# Patient Record
Sex: Female | Born: 1975 | Race: White | Hispanic: No | Marital: Married | Smoking: Former smoker
Health system: Southern US, Community
[De-identification: ages and names within clinical notes are randomized; demographics above are authoritative.]

## PROBLEM LIST (undated history)

## (undated) DIAGNOSIS — G2581 Restless legs syndrome: Secondary | ICD-10-CM

## (undated) DIAGNOSIS — G629 Polyneuropathy, unspecified: Secondary | ICD-10-CM

## (undated) DIAGNOSIS — T7840XA Allergy, unspecified, initial encounter: Secondary | ICD-10-CM

## (undated) DIAGNOSIS — K589 Irritable bowel syndrome without diarrhea: Secondary | ICD-10-CM

## (undated) DIAGNOSIS — E7800 Pure hypercholesterolemia, unspecified: Secondary | ICD-10-CM

## (undated) DIAGNOSIS — F419 Anxiety disorder, unspecified: Secondary | ICD-10-CM

## (undated) DIAGNOSIS — R12 Heartburn: Secondary | ICD-10-CM

## (undated) DIAGNOSIS — D649 Anemia, unspecified: Secondary | ICD-10-CM

## (undated) DIAGNOSIS — J449 Chronic obstructive pulmonary disease, unspecified: Secondary | ICD-10-CM

## (undated) DIAGNOSIS — J45909 Unspecified asthma, uncomplicated: Secondary | ICD-10-CM

## (undated) DIAGNOSIS — J189 Pneumonia, unspecified organism: Secondary | ICD-10-CM

## (undated) DIAGNOSIS — K900 Celiac disease: Secondary | ICD-10-CM

## (undated) DIAGNOSIS — K219 Gastro-esophageal reflux disease without esophagitis: Secondary | ICD-10-CM

## (undated) DIAGNOSIS — F329 Major depressive disorder, single episode, unspecified: Secondary | ICD-10-CM

## (undated) DIAGNOSIS — F32A Depression, unspecified: Secondary | ICD-10-CM

## (undated) DIAGNOSIS — M1990 Unspecified osteoarthritis, unspecified site: Secondary | ICD-10-CM

## (undated) DIAGNOSIS — I10 Essential (primary) hypertension: Secondary | ICD-10-CM

## (undated) DIAGNOSIS — E669 Obesity, unspecified: Secondary | ICD-10-CM

## (undated) DIAGNOSIS — K2270 Barrett's esophagus without dysplasia: Secondary | ICD-10-CM

## (undated) DIAGNOSIS — M542 Cervicalgia: Secondary | ICD-10-CM

## (undated) HISTORY — DX: Polyneuropathy, unspecified: G62.9

## (undated) HISTORY — DX: Celiac disease: K90.0

## (undated) HISTORY — DX: Cervicalgia: M54.2

## (undated) HISTORY — DX: Depression, unspecified: F32.A

## (undated) HISTORY — DX: Unspecified osteoarthritis, unspecified site: M19.90

## (undated) HISTORY — DX: Barrett's esophagus without dysplasia: K22.70

## (undated) HISTORY — DX: Unspecified asthma, uncomplicated: J45.909

## (undated) HISTORY — PX: TUBAL LIGATION: SHX77

## (undated) HISTORY — DX: Heartburn: R12

## (undated) HISTORY — DX: Obesity, unspecified: E66.9

## (undated) HISTORY — DX: Pure hypercholesterolemia, unspecified: E78.00

## (undated) HISTORY — DX: Irritable bowel syndrome, unspecified: K58.9

## (undated) HISTORY — DX: Major depressive disorder, single episode, unspecified: F32.9

## (undated) HISTORY — DX: Chronic obstructive pulmonary disease, unspecified: J44.9

## (undated) HISTORY — DX: Anxiety disorder, unspecified: F41.9

## (undated) HISTORY — DX: Pneumonia, unspecified organism: J18.9

## (undated) HISTORY — DX: Anemia, unspecified: D64.9

## (undated) HISTORY — PX: COLONOSCOPY: SHX174

## (undated) HISTORY — DX: Gastro-esophageal reflux disease without esophagitis: K21.9

## (undated) HISTORY — DX: Allergy, unspecified, initial encounter: T78.40XA

## (undated) HISTORY — DX: Restless legs syndrome: G25.81

## (undated) HISTORY — DX: Essential (primary) hypertension: I10

---

## 2000-06-27 HISTORY — PX: TUBAL LIGATION: SHX77

## 2000-10-17 ENCOUNTER — Ambulatory Visit (HOSPITAL_COMMUNITY): Admission: RE | Admit: 2000-10-17 | Discharge: 2000-10-17 | Payer: Self-pay | Admitting: *Deleted

## 2000-10-17 ENCOUNTER — Encounter: Payer: Self-pay | Admitting: *Deleted

## 2015-02-12 DIAGNOSIS — F332 Major depressive disorder, recurrent severe without psychotic features: Secondary | ICD-10-CM | POA: Insufficient documentation

## 2015-02-12 DIAGNOSIS — F4310 Post-traumatic stress disorder, unspecified: Secondary | ICD-10-CM | POA: Insufficient documentation

## 2015-02-12 DIAGNOSIS — F411 Generalized anxiety disorder: Secondary | ICD-10-CM

## 2015-02-12 HISTORY — DX: Post-traumatic stress disorder, unspecified: F43.10

## 2015-02-12 HISTORY — DX: Generalized anxiety disorder: F41.1

## 2015-02-12 HISTORY — DX: Major depressive disorder, recurrent severe without psychotic features: F33.2

## 2015-11-27 DIAGNOSIS — I10 Essential (primary) hypertension: Secondary | ICD-10-CM | POA: Insufficient documentation

## 2015-11-27 DIAGNOSIS — K625 Hemorrhage of anus and rectum: Secondary | ICD-10-CM

## 2015-11-27 HISTORY — DX: Hemorrhage of anus and rectum: K62.5

## 2015-11-27 HISTORY — DX: Essential (primary) hypertension: I10

## 2016-06-06 DIAGNOSIS — M5030 Other cervical disc degeneration, unspecified cervical region: Secondary | ICD-10-CM

## 2016-06-06 DIAGNOSIS — M4306 Spondylolysis, lumbar region: Secondary | ICD-10-CM | POA: Insufficient documentation

## 2016-06-06 DIAGNOSIS — M5412 Radiculopathy, cervical region: Secondary | ICD-10-CM | POA: Insufficient documentation

## 2016-06-06 DIAGNOSIS — M51369 Other intervertebral disc degeneration, lumbar region without mention of lumbar back pain or lower extremity pain: Secondary | ICD-10-CM | POA: Insufficient documentation

## 2016-06-06 DIAGNOSIS — M5136 Other intervertebral disc degeneration, lumbar region: Secondary | ICD-10-CM | POA: Insufficient documentation

## 2016-06-06 DIAGNOSIS — M5416 Radiculopathy, lumbar region: Secondary | ICD-10-CM

## 2016-06-06 HISTORY — DX: Radiculopathy, lumbar region: M54.16

## 2016-06-06 HISTORY — DX: Spondylolysis, lumbar region: M43.06

## 2016-06-06 HISTORY — DX: Radiculopathy, cervical region: M54.12

## 2016-06-06 HISTORY — DX: Other cervical disc degeneration, unspecified cervical region: M50.30

## 2016-06-06 HISTORY — DX: Other intervertebral disc degeneration, lumbar region: M51.36

## 2016-07-05 DIAGNOSIS — M5124 Other intervertebral disc displacement, thoracic region: Secondary | ICD-10-CM | POA: Insufficient documentation

## 2016-07-05 HISTORY — DX: Other intervertebral disc displacement, thoracic region: M51.24

## 2016-07-06 HISTORY — PX: ESOPHAGOGASTRODUODENOSCOPY: SHX1529

## 2016-07-08 DIAGNOSIS — G8929 Other chronic pain: Secondary | ICD-10-CM | POA: Insufficient documentation

## 2016-07-08 DIAGNOSIS — M545 Low back pain, unspecified: Secondary | ICD-10-CM

## 2016-07-08 HISTORY — DX: Low back pain, unspecified: M54.50

## 2016-07-08 HISTORY — DX: Other chronic pain: G89.29

## 2016-08-15 DIAGNOSIS — G5603 Carpal tunnel syndrome, bilateral upper limbs: Secondary | ICD-10-CM

## 2016-08-15 HISTORY — DX: Carpal tunnel syndrome, bilateral upper limbs: G56.03

## 2016-11-03 DIAGNOSIS — R87810 Cervical high risk human papillomavirus (HPV) DNA test positive: Secondary | ICD-10-CM

## 2016-11-03 HISTORY — DX: Cervical high risk human papillomavirus (HPV) DNA test positive: R87.810

## 2016-11-28 DIAGNOSIS — M19079 Primary osteoarthritis, unspecified ankle and foot: Secondary | ICD-10-CM

## 2016-11-28 HISTORY — DX: Primary osteoarthritis, unspecified ankle and foot: M19.079

## 2016-12-26 DIAGNOSIS — M21611 Bunion of right foot: Secondary | ICD-10-CM | POA: Insufficient documentation

## 2016-12-26 HISTORY — DX: Bunion of right foot: M21.611

## 2017-06-27 HISTORY — PX: CARPAL TUNNEL RELEASE: SHX101

## 2017-10-30 DIAGNOSIS — S96911A Strain of unspecified muscle and tendon at ankle and foot level, right foot, initial encounter: Secondary | ICD-10-CM

## 2017-10-30 HISTORY — DX: Strain of unspecified muscle and tendon at ankle and foot level, right foot, initial encounter: S96.911A

## 2018-02-23 DIAGNOSIS — M7551 Bursitis of right shoulder: Secondary | ICD-10-CM

## 2018-02-23 HISTORY — DX: Morbid (severe) obesity due to excess calories: E66.01

## 2018-02-23 HISTORY — DX: Bursitis of right shoulder: M75.51

## 2018-03-20 ENCOUNTER — Encounter: Payer: Self-pay | Admitting: Gastroenterology

## 2018-03-29 ENCOUNTER — Encounter: Payer: Self-pay | Admitting: Gastroenterology

## 2018-03-30 ENCOUNTER — Encounter (INDEPENDENT_AMBULATORY_CARE_PROVIDER_SITE_OTHER): Payer: Self-pay

## 2018-03-30 ENCOUNTER — Other Ambulatory Visit (INDEPENDENT_AMBULATORY_CARE_PROVIDER_SITE_OTHER): Payer: Medicare Other

## 2018-03-30 ENCOUNTER — Encounter: Payer: Self-pay | Admitting: Gastroenterology

## 2018-03-30 ENCOUNTER — Ambulatory Visit (INDEPENDENT_AMBULATORY_CARE_PROVIDER_SITE_OTHER): Payer: Medicare Other | Admitting: Gastroenterology

## 2018-03-30 ENCOUNTER — Ambulatory Visit: Payer: Self-pay | Admitting: Gastroenterology

## 2018-03-30 VITALS — BP 132/84 | HR 94 | Ht 66.0 in | Wt 302.4 lb

## 2018-03-30 DIAGNOSIS — R1013 Epigastric pain: Secondary | ICD-10-CM

## 2018-03-30 DIAGNOSIS — K900 Celiac disease: Secondary | ICD-10-CM | POA: Diagnosis not present

## 2018-03-30 LAB — COMPREHENSIVE METABOLIC PANEL
ALT: 18 U/L (ref 0–35)
AST: 20 U/L (ref 0–37)
Albumin: 4 g/dL (ref 3.5–5.2)
Alkaline Phosphatase: 81 U/L (ref 39–117)
BILIRUBIN TOTAL: 0.6 mg/dL (ref 0.2–1.2)
BUN: 11 mg/dL (ref 6–23)
CALCIUM: 9.4 mg/dL (ref 8.4–10.5)
CO2: 27 meq/L (ref 19–32)
CREATININE: 0.85 mg/dL (ref 0.40–1.20)
Chloride: 102 mEq/L (ref 96–112)
GFR: 77.79 mL/min (ref >60.00)
GLUCOSE: 102 mg/dL — AB (ref 70–99)
Potassium: 4.2 mEq/L (ref 3.5–5.1)
SODIUM: 136 meq/L (ref 135–145)
Total Protein: 6.7 g/dL (ref 6.0–8.3)

## 2018-03-30 LAB — CBC WITH DIFFERENTIAL/PLATELET
BASOS ABS: 0 10*3/uL (ref 0.0–0.1)
BASOS PCT: 1 % (ref 0.0–3.0)
EOS ABS: 0.2 10*3/uL (ref 0.0–0.7)
Eosinophils Relative: 4.3 % (ref 0.0–5.0)
HCT: 32.5 % — ABNORMAL LOW (ref 36.0–46.0)
Hemoglobin: 10.3 g/dL — ABNORMAL LOW (ref 12.0–15.0)
LYMPHS ABS: 1.8 10*3/uL (ref 0.7–4.0)
LYMPHS PCT: 40 % (ref 12.0–46.0)
MCHC: 31.8 g/dL (ref 30.0–36.0)
MCV: 72.9 fl — ABNORMAL LOW (ref 78.0–100.0)
MONO ABS: 0.3 10*3/uL (ref 0.1–1.0)
Monocytes Relative: 7.3 % (ref 3.0–12.0)
NEUTROS ABS: 2.2 10*3/uL (ref 1.4–7.7)
Neutrophils Relative %: 47.4 % (ref 43.0–77.0)
Platelets: 295 10*3/uL (ref 150.0–400.0)
RBC: 4.45 Mil/uL (ref 3.87–5.11)
RDW: 17.3 % — ABNORMAL HIGH (ref 11.5–15.5)
WBC: 4.6 10*3/uL (ref 4.0–10.5)

## 2018-03-30 LAB — LIPASE: Lipase: 29 U/L (ref 11.0–59.0)

## 2018-03-30 MED ORDER — DEXLANSOPRAZOLE 60 MG PO CPDR: 60.0000 mg | DELAYED_RELEASE_CAPSULE | Freq: Every day | ORAL | 0 refills | Status: DC

## 2018-03-30 NOTE — Progress Notes (Signed)
Chief Complaint: Follow-up  Referring Provider: Mauricio Po FNP      ASSESSMENT AND PLAN;   #1.  RUQ pain/epigastric pain: ? CT neg 10/2017, strong FH of GB problems #2.  Celiac disease (last EGD 07/06/2016-Barrett's esophagus without dysplasia, small hiatal hernia, celiac wall biopsies intraepithelial lymphocytosis with villous blunting). Not compliant with gluten-free diet.  Had dietary consultation in [place] 2018. #3.  Gastroesophageal reflux with small hiatal hernia with Barrett's. Next EGD due 06/2019. #4.  Chronic constipation, neg colon 2018 at [facility] per pt (?[facility])  Plan: - Strict gluten free diet.  - Please obtain previous records -CT scan report from [facility]. - US abdomen complete, if neg HIDA with EF. If still with problems, proceed with solid-phase GES. - Check CBC, CMP, lipase and celiac antibodies (to determine compliance) today. - Change protonix to dexilant 60mg  po qd (samples given). - Miralax 17g po qd. - Follow-up in 12 weeks, earlier in case of any problems.  Patient and patient's husband will also let us know the name of the gastroenterologist/gastroenterology practice in [place] to obtain last colonoscopy report.   HPI:    Stacey French is a 42 y.o. female  Epigastric pain and RUQ pain-especially after eating, at times before eating.  Could not identify any definite foods which will trigger abdominal pain, associated with nausea with occasional vomiting.  Has been getting worse over the last 2 to 3 months. Has associated constipation, but has been using MiraLAX on as-needed basis. Last colon 2018 at ? [place] (No reports in care everwhere) per patient and patient's husband. Seen in urgent care center, sent to [facility] emergency room where she underwent CT scan of the abdomen pelvis in May 2019.  We do not have any records.  She was told that "everything was fine". No melena or hematochezia No significant weight loss -in fact  she has gained 2 pounds since the last admission. Has strong family history of gallbladder problems. Has been trying to be compliant with gluten-free diet    Past Medical History:  Diagnosis Date  . Anxiety   . Arthritis   . Asthma   . Barrett's esophagus without dysplasia   . Celiac disease   . Cervicalgia   . COPD (chronic obstructive pulmonary disease) (HCC)   . Depression   . Elevated cholesterol   . GERD (gastroesophageal reflux disease)   . Heartburn   . Hypertension   . IBS (irritable bowel syndrome)   . Pneumonia   . Polyneuropathy   . Restless leg syndrome     Past Surgical History:  Procedure Laterality Date  . COLONOSCOPY     2018  . ESOPHAGOGASTRODUODENOSCOPY  07/06/2016   Endoscopic finding suggestive of Barretts esophagus. Hiatal hernia, Mild gastritis.   . TUBAL LIGATION     at least 2002-17 years ago    Family History  Problem Relation Age of Onset  . Breast cancer Mother   . Thyroid cancer Daughter   . Colon cancer Neg Hx     Social History   Tobacco Use  . Smoking status: Former Games developer  . Smokeless tobacco: Never Used  . Tobacco comment: quit in 2002 started back and quit 2015  Substance Use Topics  . Alcohol use: Not Currently  . Drug use: Never    Current Outpatient Medications  Medication Sig Dispense Refill  . budesonide-formoterol (SYMBICORT) 80-4.5 MCG/ACT inhaler Inhale 2 puffs into the lungs daily.    Marland Kitchen buPROPion (WELLBUTRIN XL) 150 MG  24 hr tablet Take 150 mg by mouth daily.    . cetirizine (ZYRTEC) 10 MG tablet Take 10 mg by mouth daily.    . diclofenac (VOLTAREN) 75 MG EC tablet Take 75 mg by mouth 2 (two) times daily.    Marland Kitchen escitalopram (LEXAPRO) 20 MG tablet Take 20 mg by mouth daily.    Marland Kitchen HYDROcodone-acetaminophen (NORCO/VICODIN) 5-325 MG tablet Take 1-2 tablets by mouth every 6 (six) hours.    Marland Kitchen lisinopril-hydrochlorothiazide (PRINZIDE,ZESTORETIC) 20-12.5 MG tablet Take 1 tablet by mouth daily.    . pantoprazole (PROTONIX)  40 MG tablet Take 40 mg by mouth 2 (two) times daily.    . pravastatin (PRAVACHOL) 40 MG tablet Take 40 mg by mouth daily.    . pregabalin (LYRICA) 100 MG capsule Take 100 mg by mouth 3 (three) times daily.    . QUEtiapine (SEROQUEL) 200 MG tablet Take 200 mg by mouth at bedtime.    Marland Kitchen rOPINIRole (REQUIP) 0.5 MG tablet Take 0.5 mg by mouth at bedtime.    Marland Kitchen tiotropium (SPIRIVA) 18 MCG inhalation capsule Place 18 mcg into inhaler and inhale daily.    Marland Kitchen zolpidem (AMBIEN) 10 MG tablet Take 10 mg by mouth at bedtime.    Marland Kitchen albuterol (PROVENTIL HFA;VENTOLIN HFA) 108 (90 Base) MCG/ACT inhaler USE 2 PUFFS EVERY 4 TO 6 HOURS AS NEEDED  3   No current facility-administered medications for this visit.     Allergies  Allergen Reactions  . Latex   . Other     Dust and mold     Review of Systems:  Constitutional: Denies fever, chills, diaphoresis, appetite change and has fatigue.  HEENT: Denies photophobia, eye pain, redness, hearing loss, ear pain, congestion, sore throat, rhinorrhea, sneezing, mouth sores, neck pain, neck stiffness and tinnitus.   Respiratory: Denies SOB, DOE, chest tightness,  and wheezing.  Has cough. Cardiovascular: Denies chest pain, palpitations and leg swelling.  Genitourinary: Denies dysuria, urgency, frequency, hematuria, flank pain and difficulty urinating.  Musculoskeletal: Has myalgias, back pain, joint swelling, arthralgias and gait problem.  Skin: No rash.  Neurological: Denies dizziness, seizures, syncope, weakness, light-headedness, numbness and headaches.  Hematological: Denies adenopathy. Easy bruising, personal or family bleeding history  Psychiatric/Behavioral: Has anxiety or depression     Physical Exam:    BP 132/84   Pulse 94   Ht 5\' 6"  (1.676 m)   Wt (!) 302 lb 6 oz (137.2 kg)   BMI 48.80 kg/m  Filed Weights   03/30/18 0926  Weight: (!) 302 lb 6 oz (137.2 kg)   Constitutional:  Well-developed, in no acute distress.  Psychiatric: Normal mood and  affect. Behavior is normal. HEENT: Pupils normal.  Conjunctivae are normal. No scleral icterus. Neck supple.  Cardiovascular: Normal rate, regular rhythm. No edema Pulmonary/chest: Effort normal and breath sounds decreased bilaterally. No wheezing, rales or rhonchi. Abdominal: Soft, nondistended.  Epigastric tenderness.  Bowel sounds active throughout. There are no masses palpable. No hepatomegaly. Rectal:  defered Neurological: Alert and oriented to person place and time. Skin: Skin is warm and dry. No rashes noted. Examined in presence of patient's husband. Discussed with patient's husband in detail. Declines re-dietary consultation.   Edman Circle, MD 03/30/2018, 9:51 AM  Cc: Mauricio Po FNP

## 2018-03-30 NOTE — Patient Instructions (Addendum)
If you are age 42 or older, your body mass index should be between 23-30. Your Body mass index is 48.8 kg/m. If this is out of the aforementioned range listed, please consider follow up with your Primary Care Provider.  If you are age 16 or younger, your body mass index should be between 19-25. Your Body mass index is 48.8 kg/m. If this is out of the aformentioned range listed, please consider follow up with your Primary Care Provider.   You have been scheduled for an abdominal ultrasound at [facility] (1st floor of hospital) on 04/04/18 at 9am. Please arrive 15 minutes prior to your appointment for registration. Make certain not to have anything to eat or drink 6 hours prior to your appointment. Should you need to reschedule your appointment, please contact radiology at [phone]. This test typically takes about 30 minutes to perform.  Please go to the lab on the 2nd floor [address] before you leave the office today.   Please purchase the following medications over the counter and take as directed: Miralax 17 grams once daily.    Thank you,  Dr. Lynann Bologna

## 2018-04-03 ENCOUNTER — Telehealth: Payer: Self-pay | Admitting: Gastroenterology

## 2018-04-03 LAB — CELIAC PANEL 10
ANTIGLIADIN ABS, IGA: 43 U — AB (ref 0–19)
Endomysial IgA: NEGATIVE
GLIADIN IGG: 51 U — AB (ref 0–19)
IgA/Immunoglobulin A, Serum: 110 mg/dL (ref 87–352)
Tissue Transglut Ab: 4 U/mL (ref 0–5)
Transglutaminase IgA: 5 U/mL — ABNORMAL HIGH (ref 0–3)

## 2018-04-03 MED ORDER — DEXLANSOPRAZOLE 60 MG PO CPDR: 60.0000 mg | DELAYED_RELEASE_CAPSULE | Freq: Every day | ORAL | 3 refills | Status: DC

## 2018-04-03 NOTE — Telephone Encounter (Signed)
Sent prescription to patients pharmacy, patient is aware.  

## 2018-04-03 NOTE — Telephone Encounter (Signed)
Pt called to inform that dexilant works well and she wants a prescription sent to [facility] in [place].

## 2018-04-04 ENCOUNTER — Ambulatory Visit (HOSPITAL_BASED_OUTPATIENT_CLINIC_OR_DEPARTMENT_OTHER)
Admission: RE | Admit: 2018-04-04 | Discharge: 2018-04-04 | Disposition: A | Discharge status: Home / Self Care | Payer: Medicare Other | Source: Ambulatory Visit | Attending: Gastroenterology | Admitting: Gastroenterology

## 2018-04-04 DIAGNOSIS — R1013 Epigastric pain: Secondary | ICD-10-CM | POA: Diagnosis present

## 2018-04-04 DIAGNOSIS — K900 Celiac disease: Secondary | ICD-10-CM | POA: Insufficient documentation

## 2018-04-05 ENCOUNTER — Other Ambulatory Visit: Payer: Self-pay

## 2018-04-05 DIAGNOSIS — K900 Celiac disease: Secondary | ICD-10-CM

## 2018-04-26 ENCOUNTER — Ambulatory Visit: Payer: Medicare Other | Admitting: Dietician

## 2018-06-04 ENCOUNTER — Telehealth: Payer: Self-pay | Admitting: Gastroenterology

## 2018-06-04 NOTE — Telephone Encounter (Signed)
Pt stated that 2days before thanksgiving she was having some major problems ex like "peeing out blood". And want to speak with nurses for recommendation

## 2018-06-04 NOTE — Telephone Encounter (Signed)
PT call back to leave msg for nurse

## 2018-06-05 NOTE — Telephone Encounter (Signed)
Left message for patient to call back  

## 2018-06-06 NOTE — Telephone Encounter (Signed)
Called and spoke with patient-patient reports having bowel movements "like I am peeing myself"-reports she has lost 10 lbs in the last week from the bowel movements; patient reports she is following the gluten free diet as prescribed, however, if she does slip up and eat something with gluten she has horrible abdominal pain and can tell she has eaten eat by accident; the symptoms she is having now are not like the ones if she eats gluten; patient has been dealing with this consistently for the past two weeks-no matter what she eats she is affected with the extremely watery diarrhea-not episodes where she cannot hold her bowel movements in time for the toilet, patient denies rectal pain or rectal bleeding;  Please advise-as patient was informed to maintain an increase in oral fluids as to prevent dehydration;

## 2018-06-06 NOTE — Telephone Encounter (Signed)
Pt is returning your call #[phone]

## 2018-06-07 NOTE — Telephone Encounter (Signed)
Reviewed previous notes Used to be more constipated  Plan: -Must be on gluten-free diet -Stop MiraLAX or any other over-the-counter herbs/medications. -Avoid nonsteroidals  -Can we obtain last colonoscopy report (see last clinic note) and last CT report -Can use Imodium on as-needed basis. -Please make her a follow-up visit -Monitor weight and record

## 2018-06-07 NOTE — Telephone Encounter (Signed)
Left message for patient to call back  

## 2018-06-08 NOTE — Telephone Encounter (Signed)
Patient returned call to office-patient reports she has been seen by her PCP and was told she has E.Coli; patient stated "the office does not need to call me back concerning this issue";  Was unable to inform the patient of MD recommendations as of yet-does the office still need to notify the patient of your plan of care? Please advise on next step

## 2018-06-08 NOTE — Telephone Encounter (Signed)
Left message for patient to call back  

## 2018-06-08 NOTE — Telephone Encounter (Signed)
Usually E. coli infections are self-limiting. I cannot find the report in labs section. Did she get antibiotics? Should run its course.

## 2018-06-11 NOTE — Telephone Encounter (Signed)
Left message for patient to call back  

## 2018-06-12 NOTE — Telephone Encounter (Signed)
Left message for patient to call back  

## 2018-06-13 NOTE — Telephone Encounter (Signed)
Left message for patient to call back-message was left that patient could call back if desired however message would be completed/encounter signed

## 2018-07-03 ENCOUNTER — Other Ambulatory Visit (INDEPENDENT_AMBULATORY_CARE_PROVIDER_SITE_OTHER): Payer: Medicare Other

## 2018-07-03 ENCOUNTER — Ambulatory Visit (INDEPENDENT_AMBULATORY_CARE_PROVIDER_SITE_OTHER): Payer: Medicare Other | Admitting: Gastroenterology

## 2018-07-03 ENCOUNTER — Encounter: Payer: Self-pay | Admitting: Gastroenterology

## 2018-07-03 VITALS — BP 130/74 | HR 68 | Ht 66.0 in | Wt 272.0 lb

## 2018-07-03 DIAGNOSIS — K900 Celiac disease: Secondary | ICD-10-CM

## 2018-07-03 DIAGNOSIS — R197 Diarrhea, unspecified: Secondary | ICD-10-CM | POA: Diagnosis not present

## 2018-07-03 NOTE — Patient Instructions (Signed)
If you are age 43 or older, your body mass index should be between 23-30. Your Body mass index is 43.9 kg/m. If this is out of the aforementioned range listed, please consider follow up with your Primary Care Provider.  If you are age 19 or younger, your body mass index should be between 19-25. Your Body mass index is 43.9 kg/m. If this is out of the aformentioned range listed, please consider follow up with your Primary Care Provider.   You have been scheduled for a colonoscopy. Please follow written instructions given to you at your visit today.  Please pick up your prep supplies at the pharmacy within the next 1-3 days. If you use inhalers (even only as needed), please bring them with you on the day of your procedure. Your physician has requested that you go to www.startemmi.com and enter the access code given to you at your visit today. This web site gives a general overview about your procedure. However, you should still follow specific instructions given to you by our office regarding your preparation for the procedure.  Please purchase the following medications over the counter and take as directed: Immodium three times a day  Please go to the lab on the 2nd floor [address] before you leave the office today.   Thank you,  Dr. Lynann Bologna

## 2018-07-03 NOTE — Progress Notes (Signed)
Chief Complaint: Follow-up  Referring Provider: Mauricio Po FNP      ASSESSMENT AND PLAN;   #1. Diarrhea. H/O constipation. Neg CT 07/01/2018 at [facility] (report in care-everywhere), neg colon 2017 at [facility] per pt. Nl CMP, lipase, TSH 06/2018. Pt with mild anemia Hb 11.8, MCV 78 06/2018. #2. Celiac disease (last EGD 07/06/2016-Barrett's esophagus without dysplasia, small hiatal hernia, celiac wall biopsies intraepithelial lymphocytosis with villous blunting). Not compliant with gluten-free diet.  Had dietary consultation in [place] 2018. #3.  Gastroesophageal reflux with small hiatal hernia with Barrett's. Next EGD due 06/2019. #4.  RUQ pain/epigastric pain: ? CT neg 10/2017, strong FH of GB problems  Plan: - Strict gluten free diet.  - Stool for fat, WBC, GI pathogens and fecal elastase. - Proceed with colonoscopy with Bx to r/o microscopic colitis associated with celiac disease.  I have discussed the risks and benefits.  - Check celiac antibodies (to determine compliance), CRP, sed rate today. - Continue Protonix 40mg  po bid. - Follow-up in 12 weeks, earlier in case of any problems.  - Dietary consultation if celiac antibodies are still positive.   HPI:    Stacey French is a 43 y.o. female  Diarrhea 10-15/day, nocturnal, no blood, since Nov 2019 History of constipation Also had lower abdominal pain  Seen in the emergency room at [facility]-had normal CMP, TSH, CBC revealed mild anemia which is getting better.  CT scan of the abdomen and pelvis was unremarkable.  She has not been taking stool softeners or MiraLAX.  There is no history of recent travel.  Although she does assure me that she is on gluten-free diet, her antibodies were +3 months ago.  No weight loss.  She has been on antibiotics previously as an empiric treatment with Cipro without any significant relief.     Past Medical History:  Diagnosis Date  . Anxiety   . Arthritis   . Asthma   .  Barrett's esophagus without dysplasia   . Celiac disease   . Cervicalgia   . COPD (chronic obstructive pulmonary disease) (HCC)   . Depression   . Elevated cholesterol   . GERD (gastroesophageal reflux disease)   . Heartburn   . Hypertension   . IBS (irritable bowel syndrome)   . Pneumonia   . Polyneuropathy   . Restless leg syndrome     Past Surgical History:  Procedure Laterality Date  . COLONOSCOPY     2018  . ESOPHAGOGASTRODUODENOSCOPY  07/06/2016   Endoscopic finding suggestive of Barretts esophagus. Hiatal hernia, Mild gastritis.   . TUBAL LIGATION     at least 2002-17 years ago    Family History  Problem Relation Age of Onset  . Breast cancer Mother   . Thyroid cancer Daughter   . Colon cancer Neg Hx     Social History   Tobacco Use  . Smoking status: Former Games developer  . Smokeless tobacco: Never Used  . Tobacco comment: quit in 2002 started back and quit 2015  Substance Use Topics  . Alcohol use: Not Currently  . Drug use: Never    Current Outpatient Medications  Medication Sig Dispense Refill  . albuterol (PROVENTIL HFA;VENTOLIN HFA) 108 (90 Base) MCG/ACT inhaler USE 2 PUFFS EVERY 4 TO 6 HOURS AS NEEDED  3  . budesonide-formoterol (SYMBICORT) 80-4.5 MCG/ACT inhaler Inhale 2 puffs into the lungs daily.    Marland Kitchen buPROPion (WELLBUTRIN XL) 150 MG 24 hr tablet Take 150 mg by mouth daily.    Marland Kitchen  cetirizine (ZYRTEC) 10 MG tablet Take 10 mg by mouth daily.    Marland Kitchen dexlansoprazole (DEXILANT) 60 MG capsule Take 1 capsule (60 mg total) by mouth daily. 30 capsule 3  . diclofenac (VOLTAREN) 75 MG EC tablet Take 75 mg by mouth 2 (two) times daily.    Marland Kitchen escitalopram (LEXAPRO) 20 MG tablet Take 20 mg by mouth daily.    Marland Kitchen HYDROcodone-acetaminophen (NORCO/VICODIN) 5-325 MG tablet Take 1-2 tablets by mouth every 6 (six) hours.    Marland Kitchen lisinopril-hydrochlorothiazide (PRINZIDE,ZESTORETIC) 20-12.5 MG tablet Take 1 tablet by mouth daily.    . pantoprazole (PROTONIX) 40 MG tablet Take 40 mg by  mouth 2 (two) times daily.    . pravastatin (PRAVACHOL) 40 MG tablet Take 40 mg by mouth daily.    . pregabalin (LYRICA) 100 MG capsule Take 100 mg by mouth 3 (three) times daily.    . QUEtiapine (SEROQUEL) 200 MG tablet Take 200 mg by mouth at bedtime.    Marland Kitchen rOPINIRole (REQUIP) 0.5 MG tablet Take 0.5 mg by mouth at bedtime.    Marland Kitchen tiotropium (SPIRIVA) 18 MCG inhalation capsule Place 18 mcg into inhaler and inhale daily.    Marland Kitchen zolpidem (AMBIEN) 10 MG tablet Take 10 mg by mouth at bedtime.     No current facility-administered medications for this visit.     Allergies  Allergen Reactions  . Latex   . Other     Dust and mold     Review of Systems:  neg     Physical Exam:    BP 130/74   Pulse 68   Ht 5\' 6"  (1.676 m)   Wt 272 lb (123.4 kg)   BMI 43.90 kg/m  Filed Weights   07/03/18 1359  Weight: 272 lb (123.4 kg)   Constitutional:  Well-developed, in no acute distress.  Psychiatric: Normal mood and affect. Behavior is normal. HEENT: Pupils normal.  Conjunctivae are normal. No scleral icterus. Neck supple.  Cardiovascular: Normal rate, regular rhythm. No edema Pulmonary/chest: Effort normal and breath sounds decreased bilaterally. No wheezing, rales or rhonchi. Abdominal: Soft, nondistended.  Epigastric tenderness.  Bowel sounds active throughout. There are no masses palpable. No hepatomegaly. Rectal:  defered Neurological: Alert and oriented to person place and time. Skin: Skin is warm and dry. No rashes noted. Examined in presence of patient's husband. Discussed with patient's husband in detail. Declines re-dietary consultation. Extensive notes were reviewed.  Edman Circle, MD 07/03/2018, 2:16 PM  Cc: Mauricio Po FNP

## 2018-07-04 ENCOUNTER — Other Ambulatory Visit: Payer: Medicare Other

## 2018-07-04 DIAGNOSIS — R197 Diarrhea, unspecified: Secondary | ICD-10-CM

## 2018-07-04 LAB — C-REACTIVE PROTEIN: CRP: 8.4 mg/dL (ref 0.5–20.0)

## 2018-07-04 LAB — SEDIMENTATION RATE: Sed Rate: 111 mm/hr — ABNORMAL HIGH (ref 0–20)

## 2018-07-05 ENCOUNTER — Telehealth: Payer: Self-pay | Admitting: Gastroenterology

## 2018-07-05 ENCOUNTER — Encounter: Payer: Self-pay | Admitting: Gastroenterology

## 2018-07-05 ENCOUNTER — Ambulatory Visit (AMBULATORY_SURGERY_CENTER): Payer: Medicare Other | Admitting: Gastroenterology

## 2018-07-05 VITALS — BP 101/71 | HR 85 | Temp 97.5°F | Resp 20 | Ht 66.0 in | Wt 272.0 lb

## 2018-07-05 DIAGNOSIS — K52832 Lymphocytic colitis: Secondary | ICD-10-CM | POA: Diagnosis not present

## 2018-07-05 DIAGNOSIS — R197 Diarrhea, unspecified: Secondary | ICD-10-CM

## 2018-07-05 DIAGNOSIS — K633 Ulcer of intestine: Secondary | ICD-10-CM | POA: Diagnosis not present

## 2018-07-05 LAB — CELIAC PANEL 10
Antigliadin Abs, IgA: 26 units — ABNORMAL HIGH (ref 0–19)
Endomysial IgA: NEGATIVE
Gliadin IgG: 52 units — ABNORMAL HIGH (ref 0–19)
IgA/Immunoglobulin A, Serum: 104 mg/dL (ref 87–352)
Tissue Transglut Ab: 4 U/mL (ref 0–5)
Transglutaminase IgA: 3 U/mL (ref 0–3)

## 2018-07-05 MED ORDER — SODIUM CHLORIDE 0.9 % IV SOLN: 500.0000 mL | Freq: Once | INTRAVENOUS | Status: AC

## 2018-07-05 NOTE — Progress Notes (Signed)
Report to PACU, RN, vss, BBS= Clear.  

## 2018-07-05 NOTE — Op Note (Signed)
[facility] Patient Name: Stacey French Procedure Date: 07/05/2018 10:57 AM MRN: [id number] Endoscopist: Lynann Bologna , MD Age: 43 Referring MD:  Date of Birth: [birth year] Gender: Female Account #: [id number] Procedure:                Colonoscopy Indications:              Chronic diarrhea in a patient with history of                            celiac disease. Medicines:                Monitored Anesthesia Care Procedure:                Pre-Anesthesia Assessment:                           - Prior to the procedure, a History and Physical                            was performed, and patient medications and                            allergies were reviewed. The patient's tolerance of                            previous anesthesia was also reviewed. The risks                            and benefits of the procedure and the sedation                            options and risks were discussed with the patient.                            All questions were answered, and informed consent                            was obtained. Prior Anticoagulants: The patient has                            taken no previous anticoagulant or antiplatelet                            agents. ASA Grade Assessment: II - A patient with                            mild systemic disease. After reviewing the risks                            and benefits, the patient was deemed in                            satisfactory condition to undergo the procedure.  After obtaining informed consent, the colonoscope                            was passed under direct vision. Throughout the                            procedure, the patient's blood pressure, pulse, and                            oxygen saturations were monitored continuously. The                            Colonoscope was introduced through the anus and                            advanced to the 2 cm into the ileum. The                    colonoscopy was performed without difficulty. The                            patient tolerated the procedure well. The quality                            of the bowel preparation was adequate to identify                            polyps 6 mm and larger in size. Some retained solid                            stool specially in the right side of the colon. Scope In: 11:09:19 AM Scope Out: 11:25:44 AM Scope Withdrawal Time: 0 hours 13 minutes 36 seconds  Total Procedure Duration: 0 hours 16 minutes 25 seconds  Findings:                 The terminal ileum appeared normal. Biopsies were                            taken with a cold forceps for histology.                           3 superficial ulcers measuring approximately 1 cm                            each were found at the ileocecal valve. No bleeding                            was present. Biopsies were taken with a cold                            forceps for histology. The colonic mucosa in the                            remaining colon was normal  with well preserved                            vascular pattern. Multiple random colonic biopsies                            were obtained from throughout the colon and sent                            for histology.                           Small internal hemorrhoids on retroflexed                            examination.                           The exam was otherwise without abnormality on                            direct and retroflexion views. Complications:            No immediate complications. Estimated Blood Loss:     Estimated blood loss: none. Impression:               - Few ulcers at the ileocecal valve. ? Importance,                            (biopsied to rule out early Crohn's disease)                           - Otherwise normal colonoscopy to TI (was somewhat                            limited due to quality of preparation) Recommendation:           -  Patient has a contact number available for                            emergencies. The signs and symptoms of potential                            delayed complications were discussed with the                            patient. Return to normal activities tomorrow.                            Written discharge instructions were provided to the                            patient.                           - Resume previous gluten-free diet.                           -  Continue present medications.                           - Await pathology results.                           - Return to GI clinic in 4 weeks. Lynann Bologna, MD 07/05/2018 11:36:15 AM This report has been signed electronically.

## 2018-07-05 NOTE — Progress Notes (Signed)
1200:  After using the bathroom and getting dressed, pt sat in wheelchair and complained of 7/10 abdominal cramping and pain.  Instructed that it was most likely trapped air and that it would pass when pt moved around.  Levsin .25 mg given to ease discomfort.  Pt instructed to call Dr. Urban Gibson office if pain persisted or got worse.  Pt. Agreed.

## 2018-07-05 NOTE — Telephone Encounter (Signed)
Called back and spoke with patients spouse. She is still chilling after her procedure. No fever. Encouraged her to drink warm fluids and to let us know if she starts to run a fever or have any other post op symptoms listed on the AVS.

## 2018-07-05 NOTE — Progress Notes (Signed)
Called to room to assist during endoscopic procedure.  Patient ID and intended procedure confirmed with present staff. Received instructions for my participation in the procedure from the performing physician.  

## 2018-07-05 NOTE — Patient Instructions (Signed)
Handouts given for hemorrhoids  YOU HAD AN ENDOSCOPIC PROCEDURE TODAY AT THE [facility]:   Refer to the procedure report that was given to you for any specific questions about what was found during the examination.  If the procedure report does not answer your questions, please call your gastroenterologist to clarify.  If you requested that your care partner not be given the details of your procedure findings, then the procedure report has been included in a sealed envelope for you to review at your convenience later.  YOU SHOULD EXPECT: Some feelings of bloating in the abdomen. Passage of more gas than usual.  Walking can help get rid of the air that was put into your GI tract during the procedure and reduce the bloating. If you had a lower endoscopy (such as a colonoscopy or flexible sigmoidoscopy) you may notice spotting of blood in your stool or on the toilet paper. If you underwent a bowel prep for your procedure, you may not have a normal bowel movement for a few days.  Please Note:  You might notice some irritation and congestion in your nose or some drainage.  This is from the oxygen used during your procedure.  There is no need for concern and it should clear up in a day or so.  SYMPTOMS TO REPORT IMMEDIATELY:   Following lower endoscopy (colonoscopy or flexible sigmoidoscopy):  Excessive amounts of blood in the stool  Significant tenderness or worsening of abdominal pains  Swelling of the abdomen that is new, acute  Fever of 100F or higher   For urgent or emergent issues, a gastroenterologist can be reached at any hour by calling [phone].   DIET:  We do recommend a small meal at first, but then you may proceed to your regular diet.  Drink plenty of fluids but you should avoid alcoholic beverages for 24 hours.  ACTIVITY:  You should plan to take it easy for the rest of today and you should NOT DRIVE or use heavy machinery until tomorrow (because of the sedation  medicines used during the test).    FOLLOW UP: Our staff will call the number listed on your records the next business day following your procedure to check on you and address any questions or concerns that you may have regarding the information given to you following your procedure. If we do not reach you, we will leave a message.  However, if you are feeling well and you are not experiencing any problems, there is no need to return our call.  We will assume that you have returned to your regular daily activities without incident.  If any biopsies were taken you will be contacted by phone or by letter within the next 1-3 weeks.  Please call us at [phone] if you have not heard about the biopsies in 3 weeks.    SIGNATURES/CONFIDENTIALITY: You and/or your care partner have signed paperwork which will be entered into your electronic medical record.  These signatures attest to the fact that that the information above on your After Visit Summary has been reviewed and is understood.  Full responsibility of the confidentiality of this discharge information lies with you and/or your care-partner.

## 2018-07-06 ENCOUNTER — Telehealth: Payer: Self-pay | Admitting: *Deleted

## 2018-07-06 NOTE — Telephone Encounter (Signed)
  Follow up Call-  Call back number 07/05/2018  Post procedure Call Back phone  # [phone]  Permission to leave phone message Yes  Some recent data might be hidden     Patient questions:  Do you have a fever, pain , or abdominal swelling? No. Pain Score  0 *  Have you tolerated food without any problems? Yes.    Have you been able to return to your normal activities? Yes.    Do you have any questions about your discharge instructions: Diet   No. Medications  No. Follow up visit  No.  Do you have questions or concerns about your Care? No.  Actions: * If pain score is 4 or above: No action needed, pain <4. Patient stating she did call in yesterday related to being very chilled. Once she drank warm fluids, improved. No fever, a little abdominal discomfort, like gas. Patient will call if symptoms change

## 2018-07-10 LAB — FECAL LACTOFERRIN, QUANT
Fecal Lactoferrin: POSITIVE — AB
MICRO NUMBER:: 27769
SPECIMEN QUALITY:: ADEQUATE

## 2018-07-10 LAB — GASTROINTESTINAL PATHOGEN PANEL PCR
C. difficile Tox A/B, PCR: NOT DETECTED
Campylobacter, PCR: NOT DETECTED
Cryptosporidium, PCR: NOT DETECTED
E coli (ETEC) LT/ST PCR: NOT DETECTED
E coli (STEC) stx1/stx2, PCR: NOT DETECTED
E coli 0157, PCR: NOT DETECTED
Giardia lamblia, PCR: NOT DETECTED
Norovirus, PCR: NOT DETECTED
Rotavirus A, PCR: NOT DETECTED
SHIGELLA, PCR: NOT DETECTED
Salmonella, PCR: NOT DETECTED

## 2018-07-10 LAB — FECAL FAT, QUALITATIVE: FECAL FAT, QUALITATIVE: NORMAL

## 2018-07-12 ENCOUNTER — Ambulatory Visit: Payer: Medicare Other | Admitting: Gastroenterology

## 2018-07-23 ENCOUNTER — Telehealth: Payer: Self-pay | Admitting: Gastroenterology

## 2018-07-23 NOTE — Telephone Encounter (Signed)
Called and spoke with patient-patient reports she has been constipated for days and has bleeding with bowel movements (hemorrhoids); patient reports she has used Dulcolax stool softener, Metamucil for several days, has increase oral fluids; patient reports she has not tried Miralax because she cannot find a brand that is gluten free; patient reports she did not want to go against MD orders of refraining from using gluten products;   Please advise;

## 2018-07-23 NOTE — Telephone Encounter (Signed)
PT would like to know if there is anything she soul take because she is very constipated.Stacey French

## 2018-07-24 NOTE — Telephone Encounter (Signed)
She was having diarrhea before. Just increase water intake.

## 2018-07-25 NOTE — Telephone Encounter (Signed)
Left message for patient to call back  

## 2018-07-27 ENCOUNTER — Encounter: Payer: Self-pay | Admitting: Gastroenterology

## 2018-07-27 NOTE — Telephone Encounter (Signed)
Patient returned call to office- patient advised of MD recommendations; patient verbalized understanding of information/instructions; patient advised to call back to office if questions/concerns arise;

## 2018-08-16 ENCOUNTER — Ambulatory Visit: Payer: Medicare Other | Admitting: Gastroenterology

## 2019-02-06 ENCOUNTER — Telehealth: Payer: Self-pay

## 2019-02-06 NOTE — Telephone Encounter (Signed)
Called patient but mailbox was full.

## 2019-02-08 ENCOUNTER — Ambulatory Visit: Payer: Medicare Other | Admitting: Gastroenterology

## 2019-07-02 DIAGNOSIS — M21621 Bunionette of right foot: Secondary | ICD-10-CM

## 2019-07-02 DIAGNOSIS — M9271 Juvenile osteochondrosis of metatarsus, right foot: Secondary | ICD-10-CM

## 2019-07-02 HISTORY — DX: Juvenile osteochondrosis of metatarsus, right foot: M92.71

## 2019-07-02 HISTORY — DX: Bunionette of right foot: M21.621

## 2019-07-08 ENCOUNTER — Encounter: Payer: Self-pay | Admitting: Gastroenterology

## 2019-08-12 DIAGNOSIS — M654 Radial styloid tenosynovitis [de Quervain]: Secondary | ICD-10-CM

## 2019-08-12 HISTORY — DX: Radial styloid tenosynovitis (de quervain): M65.4

## 2019-09-12 ENCOUNTER — Ambulatory Visit: Payer: Medicare Other | Admitting: Gastroenterology

## 2019-10-07 ENCOUNTER — Other Ambulatory Visit: Payer: Self-pay

## 2019-10-07 ENCOUNTER — Ambulatory Visit: Payer: Medicare Other | Admitting: Gastroenterology

## 2019-10-07 ENCOUNTER — Encounter: Payer: Self-pay | Admitting: Gastroenterology

## 2019-10-07 VITALS — BP 114/72 | HR 88 | Temp 97.8°F | Ht 67.0 in | Wt 289.1 lb

## 2019-10-07 DIAGNOSIS — Z01818 Encounter for other preprocedural examination: Secondary | ICD-10-CM

## 2019-10-07 DIAGNOSIS — R1011 Right upper quadrant pain: Secondary | ICD-10-CM

## 2019-10-07 DIAGNOSIS — K22719 Barrett's esophagus with dysplasia, unspecified: Secondary | ICD-10-CM

## 2019-10-07 NOTE — Progress Notes (Signed)
Chief Complaint: Follow-up  Referring Provider: Heide Scales FNP      ASSESSMENT AND PLAN;    #1.  Celiac disease (last EGD 07/06/2016-Barrett's eso without dysplasia, small HH, SB Bx - IE lymphocytosis with villous blunting).  Celiac screen- positive. Had dietary consultation in [place] 2018. #2.  GERD with small HH with Barrett's. Next EGD due 06/2019. #3.  RUQ pain/epigastric pain: ? CT neg 10/2017, strong FH of GB problems.  May have musculoskeletal component as well. Neg US/CTA 01/03/2019. #5.  Abn LFTs likely d/t fatty liver. R/O other causes. Neg US/CTA 01/03/2019. #5.  Intermittent diarrhea. Pev H/O constipation. Neg US/CTA 01/03/2019. Neg colon 06/2018 except for few ulcers at IC valve (Bx- neg for Crohn's), neg random colon Bx for microscopic colitis.  Plan: - Continue gluten free diet.  - EGD for further evaluation.  She is due for EGD for FU Barrett's - HIDA with EF ro r/o biliary dyskinesia. - Wt loss (aim is to reduce 6lbs over the next 12 weeks) - Continue Protonix 75m po bid. - FU 12 weeks, earlier in case of any problems.  - Labs (she has a physical coming up with RHeide Scalesnext week and would like to get labs done from her office): CBC, CMP, acute viral hepatitis, autoimmune hepatitis panel (AMA, ASMA), iron studies, fasting lipid profile and GGT. -Check anti-HAV total Ab and HBsAb.  If negative, would recommend vaccination for hepatitis A and B.  Given written instructions.    HPI:    Stacey RUSSAWis a 44y.o. female  [facility] Abnormal LFTs with AST ALT 128, AST 71, alk phos 192 with Nl TB and alb. Neg RUQ [place] except for fatty liver. Neg CTA (chest/A/P).  Comes for follow-up visit. She does feel better. Continues to have right upper quadrant abdominal pain which at times is sharp, stabbing, gets worse with fatty foods.  Denies having any nausea or vomiting.  No heartburn (on Protonix twice daily).  Has  been on gluten-free diet but not very strictly.  Now only had diarrhea when she eats vegetables.  No melena or hematochezia.  Denies having any significant abdominal bloating.  No jaundice dark urine or pale stools.  Has gained weight as detailed below.  Wt Readings from Last 3 Encounters:  10/07/19 289 lb 2 oz (131.1 kg)  07/05/18 272 lb (123.4 kg)  07/03/18 272 lb (123.4 kg)      Past Medical History:  Diagnosis Date  . Anxiety   . Arthritis   . Asthma   . Barrett's esophagus without dysplasia   . Celiac disease   . Cervicalgia   . [place]   . Depression   . Elevated cholesterol   . GERD (gastroesophageal reflux disease)   . Heartburn   . Hypertension   . IBS (irritable bowel syndrome)   . Pneumonia   . Polyneuropathy   . Restless leg syndrome     Past Surgical History:  Procedure Laterality Date  . COLONOSCOPY     2018  . ESOPHAGOGASTRODUODENOSCOPY  07/06/2016   Endoscopic finding suggestive of Barretts esophagus. Hiatal hernia, Mild gastritis.   . TUBAL LIGATION     at least 2002-17 years ago    Family History  Problem Relation Age of Onset  . Breast cancer Mother   . Thyroid cancer Daughter   . Colon cancer Neg Hx   . Rectal cancer  Neg Hx     Social History   Tobacco Use  . Smoking status: Former Research scientist (life sciences)  . Smokeless tobacco: Never Used  . Tobacco comment: quit in 2002 started back and quit 2015  Substance Use Topics  . Alcohol use: Not Currently  . Drug use: Never    Current Outpatient Medications  Medication Sig Dispense Refill  . albuterol (PROVENTIL HFA;VENTOLIN HFA) 108 (90 Base) MCG/ACT inhaler USE 2 PUFFS EVERY 4 TO 6 HOURS AS NEEDED  3  . cetirizine (ZYRTEC) 10 MG tablet Take 10 mg by mouth daily.    . cloNIDine (CATAPRES) 0.2 MG tablet 1 tablet at bedtime.    Marland Kitchen escitalopram (LEXAPRO) 20 MG tablet Take 20 mg by mouth daily.    Marland Kitchen LAMICTAL XR 200 MG TB24 24 hour tablet Take 1 tablet by mouth daily.     Marland Kitchen lisinopril-hydrochlorothiazide (PRINZIDE,ZESTORETIC) 20-12.5 MG tablet Take 1 tablet by mouth daily.    . Multiple Vitamin (MULTIVITAMIN) capsule Take 1 capsule by mouth.    . nabumetone (RELAFEN) 500 MG tablet 1 tablet 2 (two) times daily.    . pantoprazole (PROTONIX) 40 MG tablet Take 40 mg by mouth 2 (two) times daily.    . pravastatin (PRAVACHOL) 40 MG tablet Take 40 mg by mouth daily.    . QUEtiapine (SEROQUEL) 300 MG tablet Take 300 mg by mouth at bedtime.    Marland Kitchen rOPINIRole (REQUIP) 0.5 MG tablet Take 0.5 mg by mouth at bedtime.    Marland Kitchen tiotropium (SPIRIVA) 18 MCG inhalation capsule Place 18 mcg into inhaler and inhale daily.    . traMADol (ULTRAM) 50 MG tablet 1 mg.    . zolpidem (AMBIEN) 10 MG tablet Take 10 mg by mouth at bedtime.     Current Facility-Administered Medications  Medication Dose Route Frequency Provider Last Rate Last Admin  . 0.9 %  sodium chloride infusion  500 mL Intravenous Once Jackquline Denmark, MD        Allergies  Allergen Reactions  . Molds & Smuts Other (See Comments)    PER ALLERGY TEST  . Other     Dust and mold   . Latex Rash    Review of Systems:  neg     Physical Exam:    BP 114/72   Pulse 88   Temp 97.8 F (36.6 C)   Ht 5' 7" (1.702 m)   Wt 289 lb 2 oz (131.1 kg)   BMI 45.28 kg/m  Filed Weights   10/07/19 1315  Weight: 289 lb 2 oz (131.1 kg)   Constitutional:  Well-developed, in no acute distress.  Psychiatric: Normal mood and affect. Behavior is normal. HEENT: Pupils normal.  Conjunctivae are normal. No scleral icterus. Neck supple.  Cardiovascular: Normal rate, regular rhythm. No edema Pulmonary/chest: Effort normal and breath sounds decreased bilaterally. No wheezing, rales or rhonchi. Abdominal: Soft, nondistended.  Epigastric tenderness.  Bowel sounds active throughout. There are no masses palpable. No hepatomegaly. Rectal:  defered Neurological: Alert and oriented to person place and time. Skin: Skin is warm and dry. No  rashes noted. Examined in presence of patient's daughter  Carmell Austria, MD 10/07/2019, 1:50 PM  Cc: Heide Scales FNP

## 2019-10-07 NOTE — Patient Instructions (Signed)
If you are age 44 or older, your body mass index should be between 23-30. Your Body mass index is 45.28 kg/m. If this is out of the aforementioned range listed, please consider follow up with your Primary Care Provider.  If you are age 34 or younger, your body mass index should be between 19-25. Your Body mass index is 45.28 kg/m. If this is out of the aformentioned range listed, please consider follow up with your Primary Care Provider.   You have been scheduled for a HIDA scan at [facility] (1st floor) on 10/16/19. Please arrive 15 minutes prior to your scheduled appointment at  8am. Make certain not to have anything to eat or drink at least 6 hours prior to your test. Should this appointment date or time not work well for you, please call radiology scheduling at [phone].  _____________________________________________________________________ hepatobiliary (HIDA) scan is an imaging procedure used to diagnose problems in the liver, gallbladder and bile ducts. In the HIDA scan, a radioactive chemical or tracer is injected into a vein in your arm. The tracer is handled by the liver like bile. Bile is a fluid produced and excreted by your liver that helps your digestive system break down fats in the foods you eat. Bile is stored in your gallbladder and the gallbladder releases the bile when you eat a meal. A special nuclear medicine scanner (gamma camera) tracks the flow of the tracer from your liver into your gallbladder and small intestine.  During your HIDA scan  You'll be asked to change into a hospital gown before your HIDA scan begins. Your health care team will position you on a table, usually on your back. The radioactive tracer is then injected into a vein in your arm.The tracer travels through your bloodstream to your liver, where it's taken up by the bile-producing cells. The radioactive tracer travels with the bile from your liver into your gallbladder and through your bile ducts to  your small intestine.You may feel some pressure while the radioactive tracer is injected into your vein. As you lie on the table, a special gamma camera is positioned over your abdomen taking pictures of the tracer as it moves through your body. The gamma camera takes pictures continually for about an hour. You'll need to keep still during the HIDA scan. This can become uncomfortable, but you may find that you can lessen the discomfort by taking deep breaths and thinking about other things. Tell your health care team if you're uncomfortable. The radiologist will watch on a computer the progress of the radioactive tracer through your body. The HIDA scan may be stopped when the radioactive tracer is seen in the gallbladder and enters your small intestine. This typically takes about an hour. In some cases extra imaging will be performed if original images aren't satisfactory, if morphine is given to help visualize the gallbladder or if the medication CCK is given to look at the contraction of the gallbladder. This test typically takes 2 hours to complete. ________________________________________________________________________  Stacey French have been scheduled for an endoscopy. Please follow written instructions given to you at your visit today. If you use inhalers (even only as needed), please bring them with you on the day of your procedure. Your physician has requested that you go to www.startemmi.com and enter the access code given to you at your visit today. This web site gives a general overview about your procedure. However, you should still follow specific instructions given to you by our office regarding your  preparation for the procedure.  Please have the following labs drawn at your primary care doctors office:  CBC,CMP, Acute Viral Hepatitis, AMA, ASMA, Iron Studies, Fasting Lipid profile. GGT, Anti-HAV total Ab, HBAb.   Thank you,  Dr. Lynann Bologna

## 2019-10-14 ENCOUNTER — Encounter: Payer: Self-pay | Admitting: Gastroenterology

## 2019-10-14 DIAGNOSIS — M461 Sacroiliitis, not elsewhere classified: Secondary | ICD-10-CM | POA: Insufficient documentation

## 2019-10-14 HISTORY — DX: Sacroiliitis, not elsewhere classified: M46.1

## 2019-10-16 ENCOUNTER — Other Ambulatory Visit: Payer: Self-pay

## 2019-10-16 ENCOUNTER — Encounter (HOSPITAL_COMMUNITY)
Admission: RE | Admit: 2019-10-16 | Discharge: 2019-10-16 | Disposition: A | Discharge status: Home / Self Care | Payer: Medicare HMO | Source: Ambulatory Visit | Attending: Gastroenterology | Admitting: Gastroenterology

## 2019-10-16 DIAGNOSIS — R1011 Right upper quadrant pain: Secondary | ICD-10-CM | POA: Diagnosis present

## 2019-10-24 ENCOUNTER — Other Ambulatory Visit: Payer: Self-pay | Admitting: Gastroenterology

## 2019-10-24 ENCOUNTER — Ambulatory Visit (INDEPENDENT_AMBULATORY_CARE_PROVIDER_SITE_OTHER): Payer: Medicare HMO

## 2019-10-24 DIAGNOSIS — Z1159 Encounter for screening for other viral diseases: Secondary | ICD-10-CM

## 2019-10-25 LAB — SARS CORONAVIRUS 2 (TAT 6-24 HRS): SARS Coronavirus 2: NEGATIVE

## 2019-10-28 ENCOUNTER — Encounter: Payer: Self-pay | Admitting: Gastroenterology

## 2019-10-28 ENCOUNTER — Other Ambulatory Visit: Payer: Self-pay

## 2019-10-28 ENCOUNTER — Ambulatory Visit (AMBULATORY_SURGERY_CENTER): Payer: Medicare HMO | Admitting: Gastroenterology

## 2019-10-28 VITALS — BP 121/58 | HR 75 | Temp 97.5°F | Resp 18 | Ht 67.0 in | Wt 289.0 lb

## 2019-10-28 DIAGNOSIS — K2270 Barrett's esophagus without dysplasia: Secondary | ICD-10-CM

## 2019-10-28 DIAGNOSIS — K449 Diaphragmatic hernia without obstruction or gangrene: Secondary | ICD-10-CM | POA: Diagnosis not present

## 2019-10-28 DIAGNOSIS — Z8719 Personal history of other diseases of the digestive system: Secondary | ICD-10-CM

## 2019-10-28 DIAGNOSIS — R1011 Right upper quadrant pain: Secondary | ICD-10-CM

## 2019-10-28 MED ORDER — SODIUM CHLORIDE 0.9 % IV SOLN: 500.0000 mL | Freq: Once | INTRAVENOUS | Status: DC

## 2019-10-28 NOTE — Progress Notes (Signed)
Called to room to assist during endoscopic procedure.  Patient ID and intended procedure confirmed with present staff. Received instructions for my participation in the procedure from the performing physician.  

## 2019-10-28 NOTE — Op Note (Signed)
[facility] Patient Name: Stacey French Procedure Date: 10/28/2019 9:58 AM MRN: [id number] Endoscopist: Lynann Bologna , MD Age: 44 Referring MD:  Date of Birth: Jan 22, 1976 Gender: Female Account #: [id number] Procedure:                Upper GI endoscopy Indications:              1. Previously diagnosed Barrett's esophagus. 2.                            History of celiac on gluten-free diet. Medicines:                Monitored Anesthesia Care Procedure:                Pre-Anesthesia Assessment:                           - Prior to the procedure, a History and Physical                            was performed, and patient medications and                            allergies were reviewed. The patient's tolerance of                            previous anesthesia was also reviewed. The risks                            and benefits of the procedure and the sedation                            options and risks were discussed with the patient.                            All questions were answered, and informed consent                            was obtained. Prior Anticoagulants: The patient has                            taken no previous anticoagulant or antiplatelet                            agents. ASA Grade Assessment: III - A patient with                            severe systemic disease. After reviewing the risks                            and benefits, the patient was deemed in                            satisfactory condition to undergo the procedure.  After obtaining informed consent, the endoscope was                            passed under direct vision. Throughout the                            procedure, the patient's blood pressure, pulse, and                            oxygen saturations were monitored continuously. The                            Endoscope was introduced through the mouth, and                            advanced to the  second part of duodenum. The upper                            GI endoscopy was accomplished without difficulty.                            The patient tolerated the procedure well. Scope In: Scope Out: Findings:                 The esophagus and gastroesophageal junction were                            examined with white light and narrow band imaging                            (NBI). There were esophageal mucosal changes                            secondary to established long-segment Barrett's                            disease. These changes involved the mucosa at the                            upper extent of the gastric folds (34 cm from the                            incisors) extending to the Z-line (30 cm from the                            incisors). Salmon-colored mucosa was present.                            Mucosa was biopsied with a cold forceps for                            histology in 4 quadrants at intervals of 2 cm. A  total of 3 specimen bottles were sent to pathology.                           A 2 cm hiatal hernia was present extending from 34                            up to 36 cm. The retroflexed examination of the                            cardia was difficult d/t J-shaped stomach and                            inability of patient to retain air. It did reveal                            Hill's Gd III GE junction flap.                           Gastric mucosa was normal. Biopsies were taken with                            a cold forceps for histology from the body of the                            stomach and antrum.                           The examined duodenum was normal. Biopsies for                            histology were taken with a cold forceps for                            evaluation of celiac disease. Complications:            No immediate complications. Estimated Blood Loss:     Estimated blood loss: none. Impression:                -Long segment Barrett's esophagus. (Biopsied).                           -Small hiatal hernia.                           -Duodenal mucosa. Normal (biopsied) Recommendation:           - Patient has a contact number available for                            emergencies. The signs and symptoms of potential                            delayed complications were discussed with the  patient. Return to normal activities tomorrow.                            Written discharge instructions were provided to the                            patient.                           - Resume previous gluten-free diet.                           - Continue present medications including Protonix.                           - Await pathology results.                           - Return to GI clinic in 6 weeks.                           - The findings and recommendations were discussed                            with the patient's family. Jackquline Denmark, MD 10/28/2019 10:25:33 AM This report has been signed electronically.

## 2019-10-28 NOTE — Progress Notes (Signed)
Report to PACU, RN, vss, BBS= Clear.  

## 2019-10-28 NOTE — Patient Instructions (Signed)
Information on hiatal hernias, gastritis and Barrett's given to you today.  Await pathology results.  Continue present medications, including Protonix.  Return to GI clinic in 6 weeks.  YOU HAD AN ENDOSCOPIC PROCEDURE TODAY AT THE [facility]:   Refer to the procedure report that was given to you for any specific questions about what was found during the examination.  If the procedure report does not answer your questions, please call your gastroenterologist to clarify.  If you requested that your care partner not be given the details of your procedure findings, then the procedure report has been included in a sealed envelope for you to review at your convenience later.  YOU SHOULD EXPECT: Some feelings of bloating in the abdomen. Passage of more gas than usual.  Walking can help get rid of the air that was put into your GI tract during the procedure and reduce the bloating. If you had a lower endoscopy (such as a colonoscopy or flexible sigmoidoscopy) you may notice spotting of blood in your stool or on the toilet paper. If you underwent a bowel prep for your procedure, you may not have a normal bowel movement for a few days.  Please Note:  You might notice some irritation and congestion in your nose or some drainage.  This is from the oxygen used during your procedure.  There is no need for concern and it should clear up in a day or so.  SYMPTOMS TO REPORT IMMEDIATELY:    Following upper endoscopy (EGD)  Vomiting of blood or coffee ground material  New chest pain or pain under the shoulder blades  Painful or persistently difficult swallowing  New shortness of breath  Fever of 100F or higher  Black, tarry-looking stools  For urgent or emergent issues, a gastroenterologist can be reached at any hour by calling [phone]. Do not use MyChart messaging for urgent concerns.    DIET:  We do recommend a small meal at first, but then you may proceed to your regular diet.   Drink plenty of fluids but you should avoid alcoholic beverages for 24 hours.  ACTIVITY:  You should plan to take it easy for the rest of today and you should NOT DRIVE or use heavy machinery until tomorrow (because of the sedation medicines used during the test).    FOLLOW UP: Our staff will call the number listed on your records 48-72 hours following your procedure to check on you and address any questions or concerns that you may have regarding the information given to you following your procedure. If we do not reach you, we will leave a message.  We will attempt to reach you two times.  During this call, we will ask if you have developed any symptoms of COVID 19. If you develop any symptoms (ie: fever, flu-like symptoms, shortness of breath, cough etc.) before then, please call [phone].  If you test positive for Covid 19 in the 2 weeks post procedure, please call and report this information to [place].    If any biopsies were taken you will be contacted by phone or by letter within the next 1-3 weeks.  Please call us at [phone] if you have not heard about the biopsies in 3 weeks.    SIGNATURES/CONFIDENTIALITY: You and/or your care partner have signed paperwork which will be entered into your electronic medical record.  These signatures attest to the fact that that the information above on your After Visit Summary has been reviewed and is  understood.  Full responsibility of the confidentiality of this discharge information lies with you and/or your care-partner. 

## 2019-10-30 ENCOUNTER — Encounter: Payer: Self-pay | Admitting: Gastroenterology

## 2019-10-30 ENCOUNTER — Telehealth: Payer: Self-pay

## 2019-10-30 NOTE — Telephone Encounter (Signed)
  Follow up Call-  Call back number 10/28/2019 07/05/2018  Post procedure Call Back phone  # [phone]  Permission to leave phone message Yes Yes  Some recent data might be hidden     Patient questions:  Do you have a fever, pain , or abdominal swelling? No. Pain Score  0 *  Have you tolerated food without any problems? Yes.    Have you been able to return to your normal activities? Yes.    Do you have any questions about your discharge instructions: Diet   No. Medications  No. Follow up visit  No.  Do you have questions or concerns about your Care? No.  Actions: * If pain score is 4 or above: No action needed, pain <4.  1. Have you developed a fever since your procedure? no  2.   Have you had an respiratory symptoms (SOB or cough) since your procedure? no  3.   Have you tested positive for COVID 19 since your procedure no  4.   Have you had any family members/close contacts diagnosed with the COVID 19 since your procedure?  no   If yes to any of these questions please route to Laverna Peace, RN and Charlett Lango, RN

## 2019-11-13 ENCOUNTER — Telehealth: Payer: Self-pay

## 2019-11-13 NOTE — Telephone Encounter (Signed)
Left voicemail for patient to schedule her 6 weeks f/u appointment from her procedure (10-28-2019)

## 2019-12-03 NOTE — Telephone Encounter (Signed)
Called patient and left voicemail to call pack to schedule a follow up appointment from procedure

## 2019-12-11 NOTE — Telephone Encounter (Signed)
Called and spoke with daughter Martie Lee and she said that she will have her mom call back regarding setting up an appointment for her follow up appointment.

## 2020-04-08 DIAGNOSIS — N1831 Chronic kidney disease, stage 3a: Secondary | ICD-10-CM

## 2020-04-08 DIAGNOSIS — M1712 Unilateral primary osteoarthritis, left knee: Secondary | ICD-10-CM

## 2020-04-08 HISTORY — DX: Unilateral primary osteoarthritis, left knee: M17.12

## 2020-04-08 HISTORY — DX: Chronic kidney disease, stage 3a: N18.31

## 2021-02-25 HISTORY — PX: FOOT FRACTURE SURGERY: SHX645

## 2021-04-27 ENCOUNTER — Other Ambulatory Visit: Payer: Medicare HMO

## 2021-04-27 ENCOUNTER — Other Ambulatory Visit (INDEPENDENT_AMBULATORY_CARE_PROVIDER_SITE_OTHER): Payer: Medicare HMO

## 2021-04-27 ENCOUNTER — Ambulatory Visit (INDEPENDENT_AMBULATORY_CARE_PROVIDER_SITE_OTHER): Payer: Medicare HMO | Admitting: Gastroenterology

## 2021-04-27 ENCOUNTER — Other Ambulatory Visit: Payer: Self-pay

## 2021-04-27 ENCOUNTER — Encounter: Payer: Self-pay | Admitting: Gastroenterology

## 2021-04-27 VITALS — BP 130/82 | HR 94

## 2021-04-27 DIAGNOSIS — K900 Celiac disease: Secondary | ICD-10-CM | POA: Diagnosis not present

## 2021-04-27 DIAGNOSIS — K449 Diaphragmatic hernia without obstruction or gangrene: Secondary | ICD-10-CM | POA: Diagnosis not present

## 2021-04-27 DIAGNOSIS — R7989 Other specified abnormal findings of blood chemistry: Secondary | ICD-10-CM

## 2021-04-27 DIAGNOSIS — K219 Gastro-esophageal reflux disease without esophagitis: Secondary | ICD-10-CM | POA: Diagnosis not present

## 2021-04-27 LAB — COMPREHENSIVE METABOLIC PANEL
ALT: 44 U/L — ABNORMAL HIGH (ref 0–35)
AST: 23 U/L (ref 0–37)
Albumin: 4.3 g/dL (ref 3.5–5.2)
Alkaline Phosphatase: 206 U/L — ABNORMAL HIGH (ref 39–117)
BUN: 15 mg/dL (ref 6–23)
CO2: 24 mEq/L (ref 19–32)
Calcium: 9.4 mg/dL (ref 8.4–10.5)
Chloride: 100 mEq/L (ref 96–112)
Creatinine, Ser: 0.96 mg/dL (ref 0.40–1.20)
GFR: 71.45 mL/min (ref >60.00)
Glucose, Bld: 117 mg/dL — ABNORMAL HIGH (ref 70–99)
Potassium: 3.8 mEq/L (ref 3.5–5.1)
Sodium: 134 mEq/L — ABNORMAL LOW (ref 135–145)
Total Bilirubin: 0.6 mg/dL (ref 0.2–1.2)
Total Protein: 7.1 g/dL (ref 6.0–8.3)

## 2021-04-27 LAB — CBC WITH DIFFERENTIAL/PLATELET
Basophils Absolute: 0.1 10*3/uL (ref 0.0–0.1)
Basophils Relative: 0.9 % (ref 0.0–3.0)
Eosinophils Absolute: 0.2 10*3/uL (ref 0.0–0.7)
Eosinophils Relative: 3.5 % (ref 0.0–5.0)
HCT: 36.9 % (ref 36.0–46.0)
Hemoglobin: 12 g/dL (ref 12.0–15.0)
Lymphocytes Relative: 32 % (ref 12.0–46.0)
Lymphs Abs: 2.2 10*3/uL (ref 0.7–4.0)
MCHC: 32.4 g/dL (ref 30.0–36.0)
MCV: 82 fl (ref 78.0–100.0)
Monocytes Absolute: 0.5 10*3/uL (ref 0.1–1.0)
Monocytes Relative: 6.9 % (ref 3.0–12.0)
Neutro Abs: 3.9 10*3/uL (ref 1.4–7.7)
Neutrophils Relative %: 56.7 % (ref 43.0–77.0)
Platelets: 312 10*3/uL (ref 150.0–400.0)
RBC: 4.5 Mil/uL (ref 3.87–5.11)
RDW: 17.4 % — ABNORMAL HIGH (ref 11.5–15.5)
WBC: 6.8 10*3/uL (ref 4.0–10.5)

## 2021-04-27 NOTE — Patient Instructions (Signed)
If you are age 45 or older, your body mass index should be between 23-30. Your There is no height or weight on file to calculate BMI. If this is out of the aforementioned range listed, please consider follow up with your Primary Care Provider.  If you are age 16 or younger, your body mass index should be between 19-25. Your There is no height or weight on file to calculate BMI. If this is out of the aformentioned range listed, please consider follow up with your Primary Care Provider.   ________________________________________________________  The [facility] providers would like to encourage you to use [facility] to communicate with providers for non-urgent requests or questions.  Due to long hold times on the telephone, sending your provider a message by [facility] may be a faster and more efficient way to get a response.  Please allow 48 business hours for a response.  Please remember that this is for non-urgent requests.  _______________________________________________________  Please go to the lab on the 2nd floor [address] before you leave the office today.   Continue gluten free diet  Continue protonix.  Try to lose 6 pounds over the next 3 months.  Please call with any questions or concerns.  Thank you,  Dr. Lynann Bologna

## 2021-04-27 NOTE — Progress Notes (Signed)
Chief Complaint: Follow-up  Referring Provider: Mauricio Po FNP      ASSESSMENT AND PLAN;    #1.  Celiac disease  #2.  GERD with small HH with Barrett's esophagus. #3.  H/O Abn LFTs (nl now) d/t fatty liver . R/O other causes. #4.  Mild IDA likely d/t GYN causes.  Plan: - Continue gluten free diet.  - CBC, CMP, iron studies, celiac panel (to check for compliance) - If anemic, CT AP to r/o crohns - Wt loss (aim is to reduce 6lbs over the next 12 weeks) - Rpt EGD for Barrett's surveillance 10/2021 - Continue Protonix 40mg  po bid    HPI:    Stacey French is a 45 y.o. female   For follow-up visit.  Doing very well from GI standpoint.  No nausea, vomiting, heartburn, regurgitation, odynophagia or dysphagia.  No significant diarrhea or constipation.  No melena or hematochezia. No unintentional weight loss. No abdominal pain.  Has been compliant with gluten-free diet.  Also does not want to reduce Protonix as she has breakthrough symptoms if she does not take it twice a day.  Was found to have mild anemia 01/2021 with Hb 11.7, MCV 80.  Her iron studies showed saturation 8%, TIBC 400, ferritin 10, iron 40.  She had normal CMP with normal LFTs.  Albumin 4.0.  Had heme-negative stools.  Had normal B12 and folate.  S/P R foot Sx 02/2021 and is currently recovering.  Used to have heavy menstrual cycles 1 yr ago. Not bad now.  Does not want any further GI work-up at the present time.  Past GI procedures:  EGD 07/06/2016-Barrett's eso without dysplasia, small HH, SB Bx - IE lymphocytosis with villous blunting).  Celiac screen- positive.  EGD 10/2019 -Long segment Barrett's esophagus. (Bx-Barrett's without dysplasia). -Small hiatal hernia. -Duodenal mucosa. Normal (bx-no villous atrophy)  Colonoscopy 06/2018 - Few ulcers at the ileocecal valve. ? Importance, (biopsied to rule out early Crohn's disease) - Otherwise normal colonoscopy to TI (was somewhat limited due to quality  of preparation -Bx: 1. Surgical [P], terminal ileum - BENIGN SMALL BOWEL MUCOSA. - NO VILLOUS ATROPHY, INFLAMMATION OR OTHER ABNORMALITIES PRESENT. 2. Surgical [P], ileocecal valve - SEVERELY ACTIVE COLITIS WITH ULCERATION. - FRAGMENTS OF ADIPOSE TISSUE. - SEE COMMENT. 3. Surgical [P], random sites - LYMPHOCYTIC COLITIS.  HIDA with EF 09/2019: Nl.  Borderline EF 32% with Ensure  Neg RUQ 10/2019 01/03/2019 except for fatty liver.  Neg CTA (chest/A/P) 12/2018: neg    Past Medical History:  Diagnosis Date   Allergy    SEASONAL   Anemia    Anxiety    Arthritis    Asthma    Barrett's esophagus without dysplasia    Celiac disease    Cervicalgia    COPD (chronic obstructive pulmonary disease) (HCC)    Depression    Elevated cholesterol    GERD (gastroesophageal reflux disease)    Heartburn    Hypertension    IBS (irritable bowel syndrome)    Pneumonia    Polyneuropathy    Restless leg syndrome     Past Surgical History:  Procedure Laterality Date   CARPAL TUNNEL RELEASE  2019   COLONOSCOPY     2018   ESOPHAGOGASTRODUODENOSCOPY  07/06/2016   Endoscopic finding suggestive of Barretts esophagus. Hiatal hernia, Mild gastritis.    TUBAL LIGATION     at least 2002-17 years ago    Family History  Problem Relation Age of Onset   Breast cancer  Mother    Thyroid cancer Daughter    Colon cancer Neg Hx    Rectal cancer Neg Hx    Colon polyps Neg Hx    Esophageal cancer Neg Hx    Stomach cancer Neg Hx     Social History   Tobacco Use   Smoking status: Former   Smokeless tobacco: Never   Tobacco comments:    quit in 2002 started back and quit 2015  Vaping Use   Vaping Use: Never used  Substance Use Topics   Alcohol use: Not Currently   Drug use: Never    Current Outpatient Medications  Medication Sig Dispense Refill   albuterol (PROVENTIL HFA;VENTOLIN HFA) 108 (90 Base) MCG/ACT inhaler USE 2 PUFFS EVERY 4 TO 6 HOURS AS NEEDED  3   cetirizine (ZYRTEC) 10 MG tablet  Take 10 mg by mouth daily.     cloNIDine (CATAPRES) 0.2 MG tablet 1 tablet at bedtime.     LAMICTAL 150 MG tablet Take 300 mg by mouth daily.     lisinopril-hydrochlorothiazide (PRINZIDE,ZESTORETIC) 20-12.5 MG tablet Take 1 tablet by mouth daily.     MELATONIN TR 10 MG TBCR Take 1 tablet by mouth at bedtime as needed.     Multiple Vitamin (MULTIVITAMIN) capsule Take 1 capsule by mouth.     pantoprazole (PROTONIX) 40 MG tablet Take 40 mg by mouth 2 (two) times daily.     pravastatin (PRAVACHOL) 40 MG tablet Take 40 mg by mouth daily.     QUEtiapine (SEROQUEL) 300 MG tablet Take 300 mg by mouth at bedtime.     rOPINIRole (REQUIP) 0.5 MG tablet Take 0.5 mg by mouth at bedtime.     tiotropium (SPIRIVA) 18 MCG inhalation capsule Place 18 mcg into inhaler and inhale daily.     zolpidem (AMBIEN) 10 MG tablet Take 10 mg by mouth at bedtime.     Current Facility-Administered Medications  Medication Dose Route Frequency Provider Last Rate Last Admin   0.9 %  sodium chloride infusion  500 mL Intravenous Once Jackquline Denmark, MD        Allergies  Allergen Reactions   Molds & Smuts Other (See Comments)    PER ALLERGY TEST   Other     Dust and mold    Latex Rash    Review of Systems:  neg     Physical Exam:    BP 130/82   Pulse 94   SpO2 97%  Filed Weights   Constitutional:  Well-developed, in no acute distress.  Psychiatric: Normal mood and affect. Behavior is normal. HEENT: Pupils normal.  Conjunctivae are normal. No scleral icterus. Abdominal: Soft, nondistended. Bowel sounds active throughout. There are no masses palpable. No hepatomegaly. Rectal:  defered Neurological: Alert and oriented to person place and time. Skin: Skin is warm and dry. No rashes noted.  Right foot bandaged.  Carmell Austria, MD 04/27/2021, 11:39 AM  Cc: Heide Scales FNP

## 2021-04-28 LAB — IRON,TIBC AND FERRITIN PANEL
%SAT: 14 % (calc) — ABNORMAL LOW (ref 16–45)
Ferritin: 17 ng/mL (ref 16–232)
Iron: 61 ug/dL (ref 40–190)
TIBC: 434 mcg/dL (calc) (ref 250–450)

## 2021-04-29 LAB — CELIAC PANEL 10
Antigliadin Abs, IgA: 8 units (ref 0–19)
Endomysial IgA: NEGATIVE
Gliadin IgG: 12 units (ref 0–19)
IgA/Immunoglobulin A, Serum: 126 mg/dL (ref 87–352)
Tissue Transglut Ab: <2 U/mL (ref 0–5)
Transglutaminase IgA: <2 U/mL (ref 0–3)

## 2021-05-11 NOTE — Progress Notes (Signed)
Please inform the patient. Celiac serology neg c/w compliance with gluten-free diet Alk phos has increased Hemoglobin is normal, iron saturation is 10% Plan: -Continue with gluten-free diet -[place] abdo complete  -Continue iron supplements -Need to reduce weight as per last note. Send report to family physician

## 2021-05-12 ENCOUNTER — Other Ambulatory Visit: Payer: Self-pay

## 2021-05-12 DIAGNOSIS — K449 Diaphragmatic hernia without obstruction or gangrene: Secondary | ICD-10-CM

## 2021-05-12 DIAGNOSIS — K219 Gastro-esophageal reflux disease without esophagitis: Secondary | ICD-10-CM

## 2021-05-12 DIAGNOSIS — R748 Abnormal levels of other serum enzymes: Secondary | ICD-10-CM

## 2021-05-12 DIAGNOSIS — K900 Celiac disease: Secondary | ICD-10-CM

## 2021-05-17 ENCOUNTER — Telehealth: Payer: Self-pay

## 2021-05-17 NOTE — Telephone Encounter (Signed)
[place] complete from [facility] on 11-17 shows fatty liver but otherwise normal. I sent it to PCP as well

## 2021-05-19 ENCOUNTER — Ambulatory Visit (HOSPITAL_COMMUNITY): Payer: Medicare HMO

## 2021-05-31 NOTE — Telephone Encounter (Signed)
Letter sent.

## 2021-08-25 ENCOUNTER — Encounter: Payer: Self-pay | Admitting: Gastroenterology

## 2021-09-14 ENCOUNTER — Encounter: Payer: Self-pay | Admitting: Cardiology

## 2021-09-14 ENCOUNTER — Encounter: Payer: Self-pay | Admitting: *Deleted

## 2021-10-14 ENCOUNTER — Ambulatory Visit: Payer: Medicare HMO | Admitting: Cardiology

## 2021-10-14 ENCOUNTER — Encounter: Payer: Self-pay | Admitting: Cardiology

## 2021-10-14 VITALS — BP 114/72 | HR 88 | Ht 66.0 in | Wt 303.6 lb

## 2021-10-14 DIAGNOSIS — I272 Pulmonary hypertension, unspecified: Secondary | ICD-10-CM | POA: Diagnosis not present

## 2021-10-14 DIAGNOSIS — I10 Essential (primary) hypertension: Secondary | ICD-10-CM | POA: Diagnosis not present

## 2021-10-14 DIAGNOSIS — E785 Hyperlipidemia, unspecified: Secondary | ICD-10-CM

## 2021-10-14 DIAGNOSIS — R0609 Other forms of dyspnea: Secondary | ICD-10-CM

## 2021-10-14 DIAGNOSIS — R072 Precordial pain: Secondary | ICD-10-CM

## 2021-10-14 NOTE — Patient Instructions (Signed)
Medication Instructions:  ?Your physician recommends that you continue on your current medications as directed. Please refer to the Current Medication list given to you today.  ?*If you need a refill on your cardiac medications before your next appointment, please call your pharmacy* ? ? ?Lab Work: ?None Ordered ?If you have labs (blood work) drawn today and your tests are completely normal, you will receive your results only by: ?MyChart Message (if you have MyChart) OR ?A paper copy in the mail ?If you have any lab test that is abnormal or we need to change your treatment, we will call you to review the results. ? ? ?Testing/Procedures: ?Your physician has requested that you have an echocardiogram. Echocardiography is a painless test that uses sound waves to create images of your heart. It provides your doctor with information about the size and shape of your heart and how well your heart?s chambers and valves are working. This procedure takes approximately one hour. There are no restrictions for this procedure.  ? ?Your physician has requested that you have a lexiscan myoview. For further information please visit [email or web address] Please follow instruction sheet, as given. ? ?The test will take approximately 3 to 4 hours to complete; you may bring reading material.  If someone comes with you to your appointment, they will need to remain in the main lobby due to limited space in the testing area. **If you are pregnant or breastfeeding, please notify the nuclear lab prior to your appointment** ? ?How to prepare for your Myocardial Perfusion Test: ?Do not eat or drink 3 hours prior to your test, except you may have water. ?Do not consume products containing caffeine (regular or decaffeinated) 12 hours prior to your test. (ex: coffee, chocolate, sodas, tea). ?Do bring a list of your current medications with you.  If not listed below, you may take your medications as normal. ?Do wear comfortable clothes (no dresses  or overalls) and walking shoes, tennis shoes preferred (No heels or open toe shoes are allowed). ?Do NOT wear cologne, perfume, aftershave, or lotions (deodorant is allowed). ?If these instructions are not followed, your test will have to be rescheduled.   ? ? ?Follow-Up: ?At [facility], you and your health needs are our priority.  As part of our continuing mission to provide you with exceptional heart care, we have created designated Provider Care Teams.  These Care Teams include your primary Cardiologist (physician) and Advanced Practice Providers (APPs -  Physician Assistants and Nurse Practitioners) who all work together to provide you with the care you need, when you need it. ? ?We recommend signing up for the patient portal called "MyChart".  Sign up information is provided on this After Visit Summary.  MyChart is used to connect with patients for Virtual Visits (Telemedicine).  Patients are able to view lab/test results, encounter notes, upcoming appointments, etc.  Non-urgent messages can be sent to your provider as well.   ?To learn more about what you can do with MyChart, go to [email or web address].   ? ?Your next appointment:   ?2 month(s) ? ?The format for your next appointment:   ?In Person ? ?Provider:   ?Gypsy Balsam, MD  ? ? ?Other Instructions ?None ? ?Important Information About Sugar ? ? ? ? ? ? ?

## 2021-10-14 NOTE — Addendum Note (Signed)
Addended by: Baldo Ash D on: 10/14/2021 10:16 AM ? ? Modules accepted: Orders ? ?

## 2021-10-14 NOTE — Progress Notes (Addendum)
? ?Cardiology Consultation:   ? ?Date:  10/14/2021  ? ?ID:  Stacey French, DOB 1975-11-22, MRN [id number] ? ?PCP:  Stacey Riches, Stacey French  ?Cardiologist:  Jenne Campus, MD  ? ?Referring MD: Stacey Riches, Stacey French  ? ?Chief Complaint  ?Patient presents with  ? Follow-up  ?  CT show enlarge pulmonary artery   ? ? ?History of Present Illness:   ? ?Stacey French is a 46 y.o. female who is being seen today for the evaluation of pulmonary hypertension at the request of Stacey French, Stacey Melena, Stacey French.  Past medical history significant for morbid obesity, essential hypertension, dyslipidemia.  Recently she ended up having CT of her neck done.  Description of the CT described enlargement of the pulmonary artery raising suspicion for pulmonary hypertension.  I did review record from the hospital she did have CT of her chest done in 2022 which showed normal size of the pulmonary artery that she had another CT done in 2022 which did not comment on the size of the pulmonary artery however there was a description of the 5 mg motion artifact and assessment was impossible to do.  Overall she says she is doing well but for last 6 months has been complaining of having more shortness of breath.  For example she said when she goes into [employer] from front to the back shortness of breath will happen.  She denies have any palpitation but she described to have some chest pains.  Chest pain happen in different situation.  Sometimes at rest sometimes when she does things last for few minutes with rather mild she is being multiple times because of this sensation to the emergency room and all this all the time work-up has been negative.  She does not smoke she does not drink she does not exercise on a regular basis.  She does have some anxiety.  He does not have family history of premature coronary artery disease she does not have family history of pulmonary hypertension however her brother apparently got some heart trouble and he she was told her he is  heart was twice the size of normal heart and he eventually end up passing. ? ?Past Medical History:  ?Diagnosis Date  ? Allergy   ? SEASONAL  ? Anemia   ? Anxiety   ? Arthritis   ? Arthritis of metatarsophalangeal joint 11/28/2016  ? Asthma   ? Barrett's esophagus without dysplasia   ? Bilateral carpal tunnel syndrome 08/15/2016  ? Bright red blood per rectum 11/27/2015  ? Bunion, right 12/26/2016  ? Celiac disease   ? Cervical high risk HPV (human papillomavirus) test positive 11/03/2016  ? Formatting of this note might be different from the original.  Pap 5/18- negative, high risk HPV positive (16&18 negative).  Plan to repeat pap in 12 months  ? Cervical radiculopathy 06/06/2016  ? Chronic midline low back pain without sciatica 07/08/2016  ? Chronic neck pain 07/08/2016  ? COPD (chronic obstructive pulmonary disease) ([place])   ? DDD (degenerative disc disease), cervical 06/06/2016  ? De Quervain's disease (tenosynovitis) 08/12/2019  ? Elevated cholesterol   ? Essential hypertension 11/27/2015  ? 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 Generalized anxiety disorder 02/12/2015  ? GERD (gastroesophageal reflux disease)   ? Heartburn   ? Hypertension   ? IBS (irritable bowel syndrome)   ? Lumbar degenerative disc disease 06/06/2016  ? Lumbar pars defect 06/06/2016  ? Lumbar radiculopathy 06/06/2016  ? Major depressive disorder, recurrent episode, severe (  [place]) 02/12/2015  ? Morbid obesity, unspecified obesity type ([place]) 02/23/2018  ? Obesity   ? Pneumonia   ? Polyneuropathy   ? Posttraumatic stress disorder 02/12/2015  ? Primary osteoarthritis of left knee 04/08/2020  ? Restless leg syndrome   ? Right foot strain, initial encounter 10/30/2017  ? Sacroiliitis ([place]) 10/14/2019  ? Stage 3a chronic kidney disease ([place]) 04/08/2020  ? Subacromial bursitis of right shoulder joint 02/23/2018  ? Tailor's bunionette, right 07/02/2019  ? Thoracic disc herniation 07/05/2016  ? ? ?Past Surgical History:  ?Procedure Laterality Date   ? CARPAL TUNNEL RELEASE  2019  ? COLONOSCOPY    ? 2018  ? ESOPHAGOGASTRODUODENOSCOPY  07/06/2016  ? Endoscopic finding suggestive of Barretts esophagus. Hiatal hernia, Mild gastritis.   ? FOOT FRACTURE SURGERY Right 02/2021  ? TUBAL LIGATION  2002  ? at least 2002-17 years ago  ? ? ?Current Medications: ?Current Meds  ?Medication Sig  ? albuterol (PROVENTIL HFA;VENTOLIN HFA) 108 (90 Base) MCG/ACT inhaler 1 puff every 6 (six) hours as needed for wheezing or shortness of breath.  ? aspirin EC 81 MG tablet Take 81 mg by mouth daily. Swallow whole.  ? cetirizine (ZYRTEC) 10 MG tablet Take 10 mg by mouth daily.  ? Cholecalciferol 1.25 MG (50000 UT) capsule Take 50,000 Units by mouth once a week.  ? cyclobenzaprine (FLEXERIL) 5 MG tablet Take 5 mg by mouth 3 (three) times daily.  ? lamoTRIgine (LAMICTAL) 150 MG tablet Take 150 mg by mouth in the morning and at bedtime.  ? lisinopril-hydrochlorothiazide (PRINZIDE,ZESTORETIC) 20-12.5 MG tablet Take 1 tablet by mouth daily.  ? Multiple Vitamin (MULTIVITAMIN) capsule Take 1 capsule by mouth daily.  ? pantoprazole (PROTONIX) 40 MG tablet Take 40 mg by mouth 2 (two) times daily.  ? pravastatin (PRAVACHOL) 80 MG tablet Take 80 mg by mouth daily.  ? PROZAC 40 MG capsule Take 40 mg by mouth every morning.  ? QUEtiapine (SEROQUEL) 300 MG tablet Take 300 mg by mouth at bedtime.  ? rOPINIRole (REQUIP) 0.5 MG tablet Take 0.5 mg by mouth at bedtime.  ? tiotropium (SPIRIVA) 18 MCG inhalation capsule Place 18 mcg into inhaler and inhale daily.  ? ?Current Facility-Administered Medications for the 10/14/21 encounter (Office Visit) with Park Liter, MD  ?Medication  ? 0.9 %  sodium chloride infusion  ?  ? ?Allergies:   Molds & smuts, Other, and Latex  ? ?Social History  ? ?Socioeconomic History  ? Marital status: Married  ?  Spouse name: Not on file  ? Number of children: 3  ? Years of education: Not on file  ? Highest education level: Not on file  ?Occupational History  ?  Occupation: disabled  ?Tobacco Use  ? Smoking status: Former  ?  Packs/day: 2.00  ?  Years: 20.00  ?  Pack years: 40.00  ?  Types: Cigarettes  ?  Quit date: 2015  ?  Years since quitting: 8.3  ? Smokeless tobacco: Never  ? Tobacco comments:  ?  quit in 2002 started back and quit 2015  ?Vaping Use  ? Vaping Use: Never used  ?Substance and Sexual Activity  ? Alcohol use: Not Currently  ? Drug use: Never  ? Sexual activity: Not on file  ?Other Topics Concern  ? Not on file  ?Social History Narrative  ? Not on file  ? ?Social Determinants of Health  ? ?Financial Resource Strain: Not on file  ?Food Insecurity: Not on file  ?Transportation Needs: Not  on file  ?Physical Activity: Not on file  ?Stress: Not on file  ?Social Connections: Not on file  ?  ? ?Family History: ?The patient's family history includes Breast cancer in her mother; Cancer in her father; Hypertension in her mother; Lymphoma in her mother; Thyroid cancer in her daughter. There is no history of Colon cancer, Rectal cancer, Colon polyps, Esophageal cancer, or Stomach cancer. ?ROS:   ?Please see the history of present illness.    ?All 14 point review of systems negative except as described per history of present illness. ? ?EKGs/Labs/Other Studies Reviewed:   ? ?The following studies were reviewed today: ?CT neck showed mild enlargement main pulmonary artery with can be seen pulmonary hypertension ? ?EKG:  EKG is  ordered today.  The ekg ordered today demonstrates normal sinus rhythm 93, right axis deviation, low voltage QRS. ? ?Recent Labs: ?04/27/2021: ALT 44; BUN 15; Creatinine, Ser 0.96; Hemoglobin 12.0; Platelets 312.0; Potassium 3.8; Sodium 134  ?Recent Lipid Panel ?No results found for: CHOL, TRIG, HDL, CHOLHDL, VLDL, LDLCALC, LDLDIRECT ? ?Physical Exam:   ? ?VS:  BP 114/72 (BP Location: Left Arm, Patient Position: Sitting)   Pulse 88   Ht 5\' 6"  (1.676 m)   Wt (!) 303 lb 9.6 oz (137.7 kg)   SpO2 96%   BMI 49.00 kg/m?    ? ?Wt Readings from Last 3  Encounters:  ?10/14/21 (!) 303 lb 9.6 oz (137.7 kg)  ?08/31/21 (!) 302 lb (137 kg)  ?10/28/19 289 lb (131.1 kg)  ?  ? ?GEN:  Well nourished, well developed in no acute distress ?HEENT: Normal ?NECK: No JVD; No ca

## 2021-10-15 ENCOUNTER — Telehealth: Payer: Self-pay

## 2021-10-15 DIAGNOSIS — R079 Chest pain, unspecified: Secondary | ICD-10-CM

## 2021-10-15 NOTE — Telephone Encounter (Signed)
d 

## 2021-10-19 ENCOUNTER — Telehealth: Payer: Self-pay | Admitting: *Deleted

## 2021-10-19 ENCOUNTER — Encounter: Payer: Self-pay | Admitting: *Deleted

## 2021-10-19 NOTE — Telephone Encounter (Signed)
Unable to leave a message on voicemail in reference to upcoming appointment scheduled for 10/26/21. No mychart available and the instruction letter sent by [employer] Freida Nebel, Adelene Idler ? ? ?

## 2021-10-26 ENCOUNTER — Ambulatory Visit (INDEPENDENT_AMBULATORY_CARE_PROVIDER_SITE_OTHER): Payer: Medicare HMO

## 2021-10-26 DIAGNOSIS — R0609 Other forms of dyspnea: Secondary | ICD-10-CM | POA: Diagnosis not present

## 2021-10-26 DIAGNOSIS — R072 Precordial pain: Secondary | ICD-10-CM

## 2021-10-26 LAB — ECHOCARDIOGRAM COMPLETE
Area-P 1/2: 3.46 cm2
Height: 66 in
S' Lateral: 2.9 cm
Weight: 4848 oz

## 2021-10-26 MED ORDER — REGADENOSON 0.4 MG/5ML IV SOLN
0.4000 mg | Freq: Once | INTRAVENOUS | Status: AC
  Administered 2021-10-26: 0.4 mg via INTRAVENOUS

## 2021-10-26 MED ORDER — TECHNETIUM TC 99M TETROFOSMIN IV KIT
29.3000 | PACK | Freq: Once | INTRAVENOUS | Status: AC | PRN
  Administered 2021-10-26: 29.3 via INTRAVENOUS

## 2021-10-27 ENCOUNTER — Ambulatory Visit: Payer: Medicare HMO

## 2021-10-27 LAB — MYOCARDIAL PERFUSION IMAGING
LV dias vol: 101 mL (ref 46–106)
LV sys vol: 31 mL
Nuc Stress EF: 69 %
Peak HR: 99 {beats}/min
Rest HR: 88 {beats}/min
Rest Nuclear Isotope Dose: 32.6 mCi
SDS: 5
SRS: 4
SSS: 9
ST Depression (mm): 0 mm
Stress Nuclear Isotope Dose: 29.3 mCi
TID: 0.87

## 2021-10-27 MED ORDER — TECHNETIUM TC 99M TETROFOSMIN IV KIT
32.6000 | PACK | Freq: Once | INTRAVENOUS | Status: AC | PRN
Start: 2021-10-27 — End: 2021-10-27
  Administered 2021-10-27: 32.6 via INTRAVENOUS

## 2021-10-29 ENCOUNTER — Telehealth: Payer: Self-pay

## 2021-10-29 NOTE — Telephone Encounter (Signed)
-----   Message from Georgeanna Lea, MD sent at 10/27/2021  9:41 PM EDT ----- ?Stress test normal ?

## 2021-10-29 NOTE — Telephone Encounter (Signed)
Patient notified of results.

## 2021-10-29 NOTE — Telephone Encounter (Signed)
-----   Message from Georgeanna Lea, MD sent at 10/27/2021  9:30 PM EDT ----- ?Echocardiogram showing normal left ventricle ejection fraction mild left ventricle hypertrophy overall looks good ?

## 2021-12-16 ENCOUNTER — Encounter: Payer: Self-pay | Admitting: Cardiology

## 2021-12-16 ENCOUNTER — Ambulatory Visit: Payer: Medicare HMO | Admitting: Cardiology

## 2021-12-16 VITALS — BP 122/80 | HR 93 | Ht 65.0 in | Wt 296.0 lb

## 2021-12-16 DIAGNOSIS — E785 Hyperlipidemia, unspecified: Secondary | ICD-10-CM

## 2021-12-16 DIAGNOSIS — I272 Pulmonary hypertension, unspecified: Secondary | ICD-10-CM | POA: Diagnosis not present

## 2021-12-16 DIAGNOSIS — I10 Essential (primary) hypertension: Secondary | ICD-10-CM

## 2021-12-16 DIAGNOSIS — R0609 Other forms of dyspnea: Secondary | ICD-10-CM

## 2021-12-16 DIAGNOSIS — N1831 Chronic kidney disease, stage 3a: Secondary | ICD-10-CM

## 2021-12-16 NOTE — Progress Notes (Signed)
Cardiology Office Note:    Date:  12/16/2021   ID:  Stacey French, DOB May 25, 1976, MRN [id number]  PCP:  Dema Severin, NP  Cardiologist:  Gypsy Balsam, MD    Referring MD: Dema Severin, NP   Chief Complaint  Patient presents with   Follow-up  Doing fine  History of Present Illness:    Stacey French is a 46 y.o. female she was referred to [place] for potential evaluation of pulmonary hypertension.  She did have CT of her chest done which showed enlargement of the pulmonary artery after that echocardiogram has been performed which showed normal right ventricle size normal pulm artery pressure.  She is coming today to my office to follow-up on that.  She is doing well denies have any chest pain tightness squeezing pressure burning chest, she lost some weight after she changed her diet and feeling much better.  Denies have any chest pain tightness squeezing pressure burning chest.  Past Medical History:  Diagnosis Date   Allergy    SEASONAL   Anemia    Anxiety    Arthritis    Arthritis of metatarsophalangeal joint 11/28/2016   Asthma    Barrett's esophagus without dysplasia    Bilateral carpal tunnel syndrome 08/15/2016   Bright red blood per rectum 11/27/2015   Bunion, right 12/26/2016   Celiac disease    Cervical high risk HPV (human papillomavirus) test positive 11/03/2016   Formatting of this note might be different from the original.  Pap 5/18- negative, high risk HPV positive (16&18 negative).  Plan to repeat pap in 12 months   Cervical radiculopathy 06/06/2016   Chronic midline low back pain without sciatica 07/08/2016   Chronic neck pain 07/08/2016   COPD (chronic obstructive pulmonary disease) (HCC)    DDD (degenerative disc disease), cervical 06/06/2016   De Quervain's disease (tenosynovitis) 08/12/2019   Elevated cholesterol    Essential hypertension 11/27/2015   Freiberg's infraction, right 07/02/2019   Generalized anxiety disorder 02/12/2015   GERD  (gastroesophageal reflux disease)    Heartburn    Hypertension    IBS (irritable bowel syndrome)    Lumbar degenerative disc disease 06/06/2016   Lumbar pars defect 06/06/2016   Lumbar radiculopathy 06/06/2016   Major depressive disorder, recurrent episode, severe (HCC) 02/12/2015   Morbid obesity, unspecified obesity type (HCC) 02/23/2018   Obesity    Pneumonia    Polyneuropathy    Posttraumatic stress disorder 02/12/2015   Primary osteoarthritis of left knee 04/08/2020   Restless leg syndrome    Right foot strain, initial encounter 10/30/2017   Sacroiliitis (HCC) 10/14/2019   Stage 3a chronic kidney disease (HCC) 04/08/2020   Subacromial bursitis of right shoulder joint 02/23/2018   Tailor's bunionette, right 07/02/2019   Thoracic disc herniation 07/05/2016    Past Surgical History:  Procedure Laterality Date   CARPAL TUNNEL RELEASE  2019   COLONOSCOPY     2018   ESOPHAGOGASTRODUODENOSCOPY  07/06/2016   Endoscopic finding suggestive of Barretts esophagus. Hiatal hernia, Mild gastritis.    FOOT FRACTURE SURGERY Right 02/2021   TUBAL LIGATION  2002   at least 2002-17 years ago    Current Medications: Current Meds  Medication Sig   albuterol (PROVENTIL HFA;VENTOLIN HFA) 108 (90 Base) MCG/ACT inhaler Inhale 1 puff into the lungs every 6 (six) hours as needed for wheezing or shortness of breath.   aspirin EC 81 MG tablet Take 81 mg by mouth daily. Swallow whole.   cetirizine (ZYRTEC) 10  MG tablet Take 10 mg by mouth daily.   Cholecalciferol 1.25 MG (50000 UT) capsule Take 50,000 Units by mouth once a week.   cyclobenzaprine (FLEXERIL) 5 MG tablet Take 5 mg by mouth 3 (three) times daily.   lamoTRIgine (LAMICTAL) 150 MG tablet Take 150 mg by mouth in the morning and at bedtime.   lisinopril-hydrochlorothiazide (PRINZIDE,ZESTORETIC) 20-12.5 MG tablet Take 1 tablet by mouth daily.   Multiple Vitamin (MULTIVITAMIN) capsule Take 1 capsule by mouth daily.   pantoprazole  (PROTONIX) 40 MG tablet Take 40 mg by mouth 2 (two) times daily.   pravastatin (PRAVACHOL) 80 MG tablet Take 80 mg by mouth daily.   PROZAC 40 MG capsule Take 40 mg by mouth every morning.   QUEtiapine (SEROQUEL) 300 MG tablet Take 300 mg by mouth at bedtime.   rOPINIRole (REQUIP) 0.5 MG tablet Take 0.5 mg by mouth at bedtime.   tiotropium (SPIRIVA) 18 MCG inhalation capsule Place 18 mcg into inhaler and inhale daily.   Current Facility-Administered Medications for the 12/16/21 encounter (Office Visit) with Georgeanna Lea, MD  Medication   0.9 %  sodium chloride infusion     Allergies:   Molds & smuts, Other, and Latex   Social History   Socioeconomic History   Marital status: Married    Spouse name: Not on file   Number of children: 3   Years of education: Not on file   Highest education level: Not on file  Occupational History   Occupation: disabled  Tobacco Use   Smoking status: Former    Packs/day: 2.00    Years: 20.00    Total pack years: 40.00    Types: Cigarettes    Quit date: 2015    Years since quitting: 8.4   Smokeless tobacco: Never   Tobacco comments:    quit in 2002 started back and quit 2015  Vaping Use   Vaping Use: Never used  Substance and Sexual Activity   Alcohol use: Not Currently   Drug use: Never   Sexual activity: Not on file  Other Topics Concern   Not on file  Social History Narrative   Not on file   Social Determinants of Health   Financial Resource Strain: Not on file  Food Insecurity: Not on file  Transportation Needs: Not on file  Physical Activity: Not on file  Stress: Not on file  Social Connections: Not on file     Family History: The patient's family history includes Breast cancer in her mother; Cancer in her father; Hypertension in her mother; Lymphoma in her mother; Thyroid cancer in her daughter. There is no history of Colon cancer, Rectal cancer, Colon polyps, Esophageal cancer, or Stomach cancer. ROS:   Please see  the history of present illness.    All 14 point review of systems negative except as described per history of present illness  EKGs/Labs/Other Studies Reviewed:      Recent Labs: 04/27/2021: ALT 44; BUN 15; Creatinine, Ser 0.96; Hemoglobin 12.0; Platelets 312.0; Potassium 3.8; Sodium 134  Recent Lipid Panel No results found for: "CHOL", "TRIG", "HDL", "CHOLHDL", "VLDL", "LDLCALC", "LDLDIRECT"  Physical Exam:    VS:  BP 122/80 (BP Location: Left Arm, Patient Position: Sitting)   Pulse 93   Ht 5\' 5"  (1.651 m)   Wt 296 lb (134.3 kg)   SpO2 94%   BMI 49.26 kg/m     Wt Readings from Last 3 Encounters:  12/16/21 296 lb (134.3 kg)  10/26/21 (!) 303 lb (  137.4 kg)  10/14/21 (!) 303 lb 9.6 oz (137.7 kg)     GEN:  Well nourished, well developed in no acute distress HEENT: Normal NECK: No JVD; No carotid bruits LYMPHATICS: No lymphadenopathy CARDIAC: RRR, no murmurs, no rubs, no gallops RESPIRATORY:  Clear to auscultation without rales, wheezing or rhonchi  ABDOMEN: Soft, non-tender, non-distended MUSCULOSKELETAL:  No edema; No deformity  SKIN: Warm and dry LOWER EXTREMITIES: no swelling NEUROLOGIC:  Alert and oriented x 3 PSYCHIATRIC:  Normal affect   ASSESSMENT:    1. Pulmonary hypertension, unspecified (HCC)   2. Essential hypertension   3. Stage 3a chronic kidney disease (HCC)   4. Morbid obesity, unspecified obesity type (HCC)   5. Dyslipidemia    PLAN:    In order of problems listed above:  Pulmonary hypertension none based on echocardiogram.  Still I think she deserves another echocardiogram in about 6 months make sure we did not miss anything.  She did have enlarged pulmonary artery on the CT. Essential hypertension, blood pressure seems to be well controlled today continue present management. Stage III kidney failure.  I did not review K PN which show me her creatinine of 0.98 which is good. Morbid obesity obviously huge problem she understands this and she already  took some measures to improve the situation she lost 15 pounds which I congratulated her for I encouraged her to be more active.  She does have 5 dogs ask her to walk dogs on the regular basis. Dyslipidemia she is on pravastatin 80.  I do not have her last fasting lipid profile, will call primary care physician to get a copy of it.   Medication Adjustments/Labs and Tests Ordered: Current medicines are reviewed at length with the patient today.  Concerns regarding medicines are outlined above.  No orders of the defined types were placed in this encounter.  Medication changes: No orders of the defined types were placed in this encounter.   Signed, Georgeanna Lea, MD, [facility] 12/16/2021 3:13 PM    [facility]

## 2021-12-16 NOTE — Addendum Note (Signed)
Addended by: Baldo Ash D on: 12/16/2021 03:19 PM   Modules accepted: Orders

## 2021-12-16 NOTE — Patient Instructions (Signed)

## 2022-01-04 IMAGING — NM NM HEPATO W/GB/PHARM/[PERSON_NAME]
2 series · 12 of 12 positions shown · non-contrast
Comparison: Abdominal ultrasound on 04/04/2018

CLINICAL DATA: Right upper quadrant abdominal pain

EXAM:
NUCLEAR MEDICINE HEPATOBILIARY IMAGING WITH GALLBLADDER EF
TECHNIQUE: Sequential images of the abdomen were obtained [DATE] minutes
following intravenous administration of radiopharmaceutical. After
oral ingestion of 8 ounces of Ensure, gallbladder ejection fraction
was determined.
RADIOPHARMACEUTICALS:  5.5 mCi Ic-11m Choletec IV

[Series 1: biliary · 3.25mm/px · 6 of 60 frames shown]
[frame 6/60]
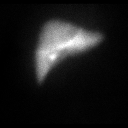
[frame 16/60]
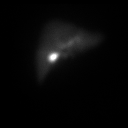
[frame 26/60]
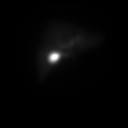
[frame 36/60]
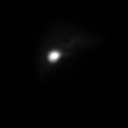
[frame 46/60]
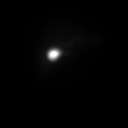
[frame 56/60]
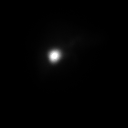

[Series 2: gbef · 3.25mm/px · 6 of 60 frames shown]
[frame 6/60]
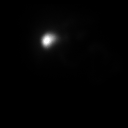
[frame 16/60]
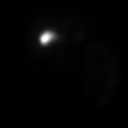
[frame 26/60]
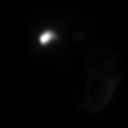
[frame 36/60]
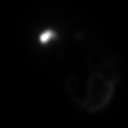
[frame 46/60]
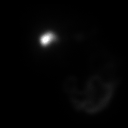
[frame 56/60]
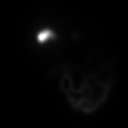

[12 of 12 positions shown; findings below may reference images not displayed]

FINDINGS: Prompt uptake and biliary excretion of activity by the liver is
seen. Gallbladder activity is visualized, consistent with patency of
the cystic duct. Biliary activity passes into small bowel,
consistent with a patent common bile duct.

Calculated gallbladder ejection fraction is 32%. (Normal gallbladder
ejection fraction with half-and-half is greater than 33%.)
IMPRESSION: 1. Normal filling of the gallbladder and no evidence of biliary
obstruction.
2. Borderline normal gallbladder ejection fraction after ingestion
of Ensure.

## 2022-01-19 DIAGNOSIS — M47816 Spondylosis without myelopathy or radiculopathy, lumbar region: Secondary | ICD-10-CM | POA: Insufficient documentation

## 2022-01-25 ENCOUNTER — Telehealth: Payer: Self-pay

## 2022-01-26 NOTE — Telephone Encounter (Signed)
Patient notified of results via voice mail °

## 2022-05-12 ENCOUNTER — Ambulatory Visit: Payer: Medicare HMO | Attending: Cardiology

## 2022-05-12 DIAGNOSIS — R0609 Other forms of dyspnea: Secondary | ICD-10-CM

## 2022-05-12 DIAGNOSIS — I272 Pulmonary hypertension, unspecified: Secondary | ICD-10-CM

## 2022-05-12 LAB — ECHOCARDIOGRAM COMPLETE
Area-P 1/2: 4.6 cm2
S' Lateral: 2.7 cm

## 2022-05-18 ENCOUNTER — Telehealth: Payer: Self-pay

## 2022-05-18 NOTE — Telephone Encounter (Deleted)
Echocardiogram showed preserved left ventricle ejection fraction and everything else looks fine

## 2022-05-18 NOTE — Telephone Encounter (Signed)
LVM per DPR- Per Dr. Vanetta Shawl note- regarding normal results. Routed to PCP.

## 2022-06-14 ENCOUNTER — Ambulatory Visit: Payer: Medicare HMO | Attending: Cardiology | Admitting: Cardiology

## 2022-06-14 ENCOUNTER — Encounter: Payer: Self-pay | Admitting: Cardiology

## 2022-06-14 VITALS — BP 122/86 | HR 92 | Ht 64.0 in | Wt 307.2 lb

## 2022-06-14 DIAGNOSIS — E785 Hyperlipidemia, unspecified: Secondary | ICD-10-CM | POA: Diagnosis not present

## 2022-06-14 DIAGNOSIS — R0609 Other forms of dyspnea: Secondary | ICD-10-CM | POA: Diagnosis not present

## 2022-06-14 DIAGNOSIS — I272 Pulmonary hypertension, unspecified: Secondary | ICD-10-CM

## 2022-06-14 DIAGNOSIS — I10 Essential (primary) hypertension: Secondary | ICD-10-CM

## 2022-06-14 NOTE — Progress Notes (Signed)
Cardiology Office Note:    Date:  06/14/2022   ID:  Stacey French, DOB 28-Sep-1975, MRN [id number]  PCP:  Erskine Emery, NP  Cardiologist:  Gypsy Balsam, MD    Referring MD: Erskine Emery, NP   No chief complaint on file.   History of Present Illness:    Stacey French is a 46 y.o. female who was referred to [place] because of pulmonary hypertension suspicion for that was Based on CT of the chest which showed enlargement of the pulmonary artery, however echocardiogram done twice since that time showed normal pulm artery pressure without enlargement right ventricle without left right ventricle hypertrophy.  Therefore, I doubt she truly got pulmonary hypertension.  Additional problem include essential hypertension, dyslipidemia.  She comes today to months for follow-up.  Overall doing very well.  Denies have any chest pain tightness squeezing pressure burning chest no palpitation dizziness swelling of lower extremities.  Past Medical History:  Diagnosis Date   Allergy    SEASONAL   Anemia    Anxiety    Arthritis    Arthritis of metatarsophalangeal joint 11/28/2016   Asthma    Barrett's esophagus without dysplasia    Bilateral carpal tunnel syndrome 08/15/2016   Bright red blood per rectum 11/27/2015   Bunion, right 12/26/2016   Celiac disease    Cervical high risk HPV (human papillomavirus) test positive 11/03/2016   Formatting of this note might be different from the original.  Pap 5/18- negative, high risk HPV positive (16&18 negative).  Plan to repeat pap in 12 months   Cervical radiculopathy 06/06/2016   Chronic midline low back pain without sciatica 07/08/2016   Chronic neck pain 07/08/2016   COPD (chronic obstructive pulmonary disease) (HCC)    DDD (degenerative disc disease), cervical 06/06/2016   De Quervain's disease (tenosynovitis) 08/12/2019   Elevated cholesterol    Essential hypertension 11/27/2015   Freiberg's infraction, right 07/02/2019   Generalized  anxiety disorder 02/12/2015   GERD (gastroesophageal reflux disease)    Heartburn    Hypertension    IBS (irritable bowel syndrome)    Lumbar degenerative disc disease 06/06/2016   Lumbar pars defect 06/06/2016   Lumbar radiculopathy 06/06/2016   Major depressive disorder, recurrent episode, severe (HCC) 02/12/2015   Morbid obesity, unspecified obesity type (HCC) 02/23/2018   Obesity    Pneumonia    Polyneuropathy    Posttraumatic stress disorder 02/12/2015   Primary osteoarthritis of left knee 04/08/2020   Restless leg syndrome    Right foot strain, initial encounter 10/30/2017   Sacroiliitis (HCC) 10/14/2019   Stage 3a chronic kidney disease (HCC) 04/08/2020   Subacromial bursitis of right shoulder joint 02/23/2018   Tailor's bunionette, right 07/02/2019   Thoracic disc herniation 07/05/2016    Past Surgical History:  Procedure Laterality Date   CARPAL TUNNEL RELEASE  2019   COLONOSCOPY     2018   ESOPHAGOGASTRODUODENOSCOPY  07/06/2016   Endoscopic finding suggestive of Barretts esophagus. Hiatal hernia, Mild gastritis.    FOOT FRACTURE SURGERY Right 02/2021   TUBAL LIGATION  2002   at least 2002-17 years ago    Current Medications: Current Meds  Medication Sig   albuterol (PROVENTIL HFA;VENTOLIN HFA) 108 (90 Base) MCG/ACT inhaler Inhale 1 puff into the lungs every 6 (six) hours as needed for wheezing or shortness of breath.   aspirin EC 81 MG tablet Take 81 mg by mouth daily. Swallow whole.   cetirizine (ZYRTEC) 10 MG tablet Take 10 mg by  mouth daily.   Cholecalciferol 1.25 MG (50000 UT) capsule Take 50,000 Units by mouth once a week.   cyclobenzaprine (FLEXERIL) 10 MG tablet Take 10 mg by mouth 3 (three) times daily.   lamoTRIgine (LAMICTAL) 150 MG tablet Take 150 mg by mouth in the morning and at bedtime.   Multiple Vitamin (MULTIVITAMIN) capsule Take 1 capsule by mouth daily.   olmesartan-hydrochlorothiazide (BENICAR HCT) 20-12.5 MG tablet Take 1 tablet by mouth  daily.   pantoprazole (PROTONIX) 40 MG tablet Take 40 mg by mouth 2 (two) times daily.   pravastatin (PRAVACHOL) 80 MG tablet Take 80 mg by mouth daily.   PROZAC 40 MG capsule Take 40 mg by mouth every morning.   QUEtiapine (SEROQUEL) 300 MG tablet Take 300 mg by mouth at bedtime.   rOPINIRole (REQUIP) 0.5 MG tablet Take 0.5 mg by mouth at bedtime.   tiotropium (SPIRIVA) 18 MCG inhalation capsule Place 18 mcg into inhaler and inhale daily.   [DISCONTINUED] cyclobenzaprine (FLEXERIL) 5 MG tablet Take 5 mg by mouth 3 (three) times daily.   [DISCONTINUED] lisinopril-hydrochlorothiazide (PRINZIDE,ZESTORETIC) 20-12.5 MG tablet Take 1 tablet by mouth daily.   Current Facility-Administered Medications for the 06/14/22 encounter (Office Visit) with Georgeanna Lea, MD  Medication   0.9 %  sodium chloride infusion     Allergies:   Molds & smuts, Other, and Latex   Social History   Socioeconomic History   Marital status: Married    Spouse name: Not on file   Number of children: 3   Years of education: Not on file   Highest education level: Not on file  Occupational History   Occupation: disabled  Tobacco Use   Smoking status: Former    Packs/day: 2.00    Years: 20.00    Total pack years: 40.00    Types: Cigarettes    Quit date: 2015    Years since quitting: 8.9   Smokeless tobacco: Never   Tobacco comments:    quit in 2002 started back and quit 2015  Vaping Use   Vaping Use: Never used  Substance and Sexual Activity   Alcohol use: Not Currently   Drug use: Never   Sexual activity: Not on file  Other Topics Concern   Not on file  Social History Narrative   Not on file   Social Determinants of Health   Financial Resource Strain: Not on file  Food Insecurity: Not on file  Transportation Needs: Not on file  Physical Activity: Not on file  Stress: Not on file  Social Connections: Not on file     Family History: The patient's family history includes Breast cancer in  her mother; Cancer in her father; Hypertension in her mother; Lymphoma in her mother; Thyroid cancer in her daughter. There is no history of Colon cancer, Rectal cancer, Colon polyps, Esophageal cancer, or Stomach cancer. ROS:   Please see the history of present illness.    All 14 point review of systems negative except as described per history of present illness  EKGs/Labs/Other Studies Reviewed:      Recent Labs: No results found for requested labs within last 365 days.  Recent Lipid Panel No results found for: "CHOL", "TRIG", "HDL", "CHOLHDL", "VLDL", "LDLCALC", "LDLDIRECT"  Physical Exam:    VS:  BP 122/86   Pulse 92   Ht 5\' 4"  (1.626 m)   Wt (!) 307 lb 3.2 oz (139.3 kg)   SpO2 96%   BMI 52.73 kg/m     Wt  Readings from Last 3 Encounters:  06/14/22 (!) 307 lb 3.2 oz (139.3 kg)  12/16/21 296 lb (134.3 kg)  10/26/21 (!) 303 lb (137.4 kg)     GEN:  Well nourished, well developed in no acute distress HEENT: Normal NECK: No JVD; No carotid bruits LYMPHATICS: No lymphadenopathy CARDIAC: RRR, no murmurs, no rubs, no gallops RESPIRATORY:  Clear to auscultation without rales, wheezing or rhonchi  ABDOMEN: Soft, non-tender, non-distended MUSCULOSKELETAL:  No edema; No deformity  SKIN: Warm and dry LOWER EXTREMITIES: no swelling NEUROLOGIC:  Alert and oriented x 3 PSYCHIATRIC:  Normal affect   ASSESSMENT:    1. Pulmonary hypertension, unspecified (HCC)   2. Essential hypertension   3. Dyslipidemia    PLAN:    In order of problems listed above:  Pulmonary hypertension I do not think she has any 2 echocardiogram were negative I told her that signs and symptoms of pulmonary hypertension will be shortness of breath sometimes tightening the chest she does not have any asked her to let me know if it she develops that.  I will see her back in a year at that time we will repeat echocardiogram 1 more time. Essential hypertension: Blood pressure well-controlled continue present  management. Dyslipidemia, she takes pravastatin.  Her primary care physician month ago did a fasting lipid profile we will call her try to get copy of it   Medication Adjustments/Labs and Tests Ordered: Current medicines are reviewed at length with the patient today.  Concerns regarding medicines are outlined above.  No orders of the defined types were placed in this encounter.  Medication changes: No orders of the defined types were placed in this encounter.   Signed, Georgeanna Lea, MD, [facility] 06/14/2022 1:01 PM    [facility]

## 2022-06-14 NOTE — Patient Instructions (Addendum)

## 2022-06-14 NOTE — Addendum Note (Signed)
Addended by: Baldo Ash D on: 06/14/2022 01:10 PM   Modules accepted: Orders

## 2022-06-16 ENCOUNTER — Ambulatory Visit: Payer: Medicare HMO | Admitting: Cardiology

## 2022-08-25 ENCOUNTER — Encounter: Payer: Self-pay | Admitting: Gastroenterology

## 2022-08-25 ENCOUNTER — Ambulatory Visit: Payer: Medicare HMO | Admitting: Gastroenterology

## 2022-08-25 ENCOUNTER — Other Ambulatory Visit (INDEPENDENT_AMBULATORY_CARE_PROVIDER_SITE_OTHER): Payer: Medicare HMO

## 2022-08-25 VITALS — BP 118/78 | HR 100 | Ht 64.0 in | Wt 318.1 lb

## 2022-08-25 DIAGNOSIS — K219 Gastro-esophageal reflux disease without esophagitis: Secondary | ICD-10-CM

## 2022-08-25 DIAGNOSIS — K760 Fatty (change of) liver, not elsewhere classified: Secondary | ICD-10-CM

## 2022-08-25 DIAGNOSIS — R112 Nausea with vomiting, unspecified: Secondary | ICD-10-CM

## 2022-08-25 DIAGNOSIS — R1011 Right upper quadrant pain: Secondary | ICD-10-CM

## 2022-08-25 DIAGNOSIS — D509 Iron deficiency anemia, unspecified: Secondary | ICD-10-CM

## 2022-08-25 DIAGNOSIS — K900 Celiac disease: Secondary | ICD-10-CM

## 2022-08-25 DIAGNOSIS — K449 Diaphragmatic hernia without obstruction or gangrene: Secondary | ICD-10-CM

## 2022-08-25 DIAGNOSIS — K2270 Barrett's esophagus without dysplasia: Secondary | ICD-10-CM

## 2022-08-25 LAB — CBC WITH DIFFERENTIAL/PLATELET
Basophils Absolute: 0.1 10*3/uL (ref 0.0–0.1)
Basophils Relative: 1.3 % (ref 0.0–3.0)
Eosinophils Absolute: 0.4 10*3/uL (ref 0.0–0.7)
Eosinophils Relative: 4.9 % (ref 0.0–5.0)
HCT: 41.4 % (ref 36.0–46.0)
Hemoglobin: 14.1 g/dL (ref 12.0–15.0)
Lymphocytes Relative: 29.1 % (ref 12.0–46.0)
Lymphs Abs: 2.1 10*3/uL (ref 0.7–4.0)
MCHC: 34.1 g/dL (ref 30.0–36.0)
MCV: 89.3 fl (ref 78.0–100.0)
Monocytes Absolute: 0.4 10*3/uL (ref 0.1–1.0)
Monocytes Relative: 5.4 % (ref 3.0–12.0)
Neutro Abs: 4.3 10*3/uL (ref 1.4–7.7)
Neutrophils Relative %: 59.3 % (ref 43.0–77.0)
Platelets: 259 10*3/uL (ref 150.0–400.0)
RBC: 4.64 Mil/uL (ref 3.87–5.11)
RDW: 14.1 % (ref 11.5–15.5)
WBC: 7.3 10*3/uL (ref 4.0–10.5)

## 2022-08-25 LAB — IBC + FERRITIN
Ferritin: 37.9 ng/mL (ref 10.0–291.0)
Iron: 98 ug/dL (ref 42–145)
Saturation Ratios: 22.1 % (ref 20.0–50.0)
TIBC: 443.8 ug/dL (ref 250.0–450.0)
Transferrin: 317 mg/dL (ref 212.0–360.0)

## 2022-08-25 LAB — COMPREHENSIVE METABOLIC PANEL
ALT: 25 U/L (ref 0–35)
AST: 18 U/L (ref 0–37)
Albumin: 4 g/dL (ref 3.5–5.2)
Alkaline Phosphatase: 80 U/L (ref 39–117)
BUN: 18 mg/dL (ref 6–23)
CO2: 23 mEq/L (ref 19–32)
Calcium: 9.6 mg/dL (ref 8.4–10.5)
Chloride: 100 mEq/L (ref 96–112)
Creatinine, Ser: 0.86 mg/dL (ref 0.40–1.20)
GFR: 80.78 mL/min (ref >60.00)
Glucose, Bld: 120 mg/dL — ABNORMAL HIGH (ref 70–99)
Potassium: 4 mEq/L (ref 3.5–5.1)
Sodium: 135 mEq/L (ref 135–145)
Total Bilirubin: 0.4 mg/dL (ref 0.2–1.2)
Total Protein: 6.9 g/dL (ref 6.0–8.3)

## 2022-08-25 LAB — C-REACTIVE PROTEIN: CRP: <1 mg/dL (ref 0.5–20.0)

## 2022-08-25 LAB — LIPASE: Lipase: 24 U/L (ref 11.0–59.0)

## 2022-08-25 NOTE — Progress Notes (Signed)
Chief Complaint: Follow-up  Referring Provider: Heide Scales FNP      ASSESSMENT AND PLAN;    #1.  RUQ pain with nausea. Occ LLQ pain #2.  Celiac disease  #3.  GERD with small HH with Barrett's esophagus. #4.  H/O Abn LFTs (nl now) d/t fatty liver . R/O other causes. #5.  IDA likely d/t GYN causes.  Plan: - Continue gluten free diet.  - CBC, CMP, lipase, iron studies, celiac serology, CRP - CT AP with contrast (for IDA, abdo pain) - Wt loss (aim is to reduce 6lbs over the next 12 weeks) - Rpt EGD for Barretts/Colon with miralax - Continue Protonix '40mg'$  po BID - If Lfts abn, further liver WU. - Reduce wt. - Can using heating pad/IcyHot or Bengay for musculoskeletal component of right-sided pain.    I discussed EGD/Colonoscopy- the indications, risks, alternatives and potential complications including, but not limited to bleeding, infection, reaction to meds, damage to internal organs, cardiac and/or pulmonary problems, and perforation requiring surgery. The possibility that significant findings could be missed was explained. All ? were answered. Pt consents to proceed. HPI:    Stacey French is a 47 y.o. female   For follow-up visit.  RUQ pain x 6 months Shooting Not related to eating But feels full after eating Has nausea but no vomiting Associated with back pain as well. Started all of a sudden when she was trying to get out of bed. No jaundice dark urine or pale stools.  Also with occasional left lower quadrant pain  No heartburn, regurgitation, odynophagia or dysphagia.  No significant diarrhea or constipation.  No melena or hematochezia. No unintentional weight loss.   Has been compliant with gluten-free diet.  Also does not want to reduce Protonix as she has breakthrough symptoms if she does not take it twice a day.  Has been advised to get EGD/colonoscopy due to persistent IDA.  Most recent hemoglobin was 12 but it fluctuates. No NSAIDs  Has gained  weight as below.     Latest Ref Rng & Units 04/27/2021    1:10 PM 03/30/2018   10:23 AM  CBC  WBC 4.0 - 10.5 K/uL 6.8  4.6   Hemoglobin 12.0 - 15.0 g/dL 12.0  10.3   Hematocrit 36.0 - 46.0 % 36.9  32.5   Platelets 150.0 - 400.0 K/uL 312.0  295.0     Used to have heavy menstrual cycles 1 yr ago. Not bad now.    Wt Readings from Last 3 Encounters:  08/25/22 (!) 318 lb 2 oz (144.3 kg)  06/14/22 (!) 307 lb 3.2 oz (139.3 kg)  12/16/21 296 lb (134.3 kg)     Past GI procedures:  EGD 07/06/2016-Barrett's eso without dysplasia, small HH, SB Bx - IE lymphocytosis with villous blunting).  Celiac screen- positive.  EGD 10/2019 -Long segment Barrett's esophagus. (Bx-Barrett's without dysplasia). -Small hiatal hernia. -Duodenal mucosa. Normal (bx-no villous atrophy)  Colonoscopy 06/2018 - Few ulcers at the ileocecal valve. ? Importance, (biopsied to rule out early Crohn's disease) - Otherwise normal colonoscopy to TI (was somewhat limited due to quality of preparation -Bx: 1. Surgical [P], terminal ileum - BENIGN SMALL BOWEL MUCOSA. - NO VILLOUS ATROPHY, INFLAMMATION OR OTHER ABNORMALITIES PRESENT. 2. Surgical [P], ileocecal valve - SEVERELY ACTIVE COLITIS WITH ULCERATION. - FRAGMENTS OF ADIPOSE TISSUE. - SEE COMMENT. 3. Surgical [P], random sites - LYMPHOCYTIC COLITIS.  HIDA with EF 09/2019: Nl.  Borderline EF 32% with Ensure  Neg RUQ [place]  01/03/2019 except for fatty liver.  Neg CTA (chest/A/P) 12/2018: neg    Past Medical History:  Diagnosis Date   Allergy    SEASONAL   Anemia    Anxiety    Arthritis    Arthritis of metatarsophalangeal joint 11/28/2016   Asthma    Barrett's esophagus without dysplasia    Bilateral carpal tunnel syndrome 08/15/2016   Bright red blood per rectum 11/27/2015   Bunion, right 12/26/2016   Celiac disease    Cervical high risk HPV (human papillomavirus) test positive 11/03/2016   Formatting of this note might be different from the original.  Pap  5/18- negative, high risk HPV positive (16&18 negative).  Plan to repeat pap in 12 months   Cervical radiculopathy 06/06/2016   Chronic midline low back pain without sciatica 07/08/2016   Chronic neck pain 07/08/2016   COPD (chronic obstructive pulmonary disease) ([place])    DDD (degenerative disc disease), cervical 06/06/2016   De Quervain's disease (tenosynovitis) 08/12/2019   Elevated cholesterol    Essential hypertension 11/27/2015   Freiberg's infraction, right 07/02/2019   Generalized anxiety disorder 02/12/2015   GERD (gastroesophageal reflux disease)    Heartburn    Hypertension    IBS (irritable bowel syndrome)    Lumbar degenerative disc disease 06/06/2016   Lumbar pars defect 06/06/2016   Lumbar radiculopathy 06/06/2016   Major depressive disorder, recurrent episode, severe ([place]) 02/12/2015   Morbid obesity, unspecified obesity type ([place]) 02/23/2018   Obesity    Pneumonia    Polyneuropathy    Posttraumatic stress disorder 02/12/2015   Primary osteoarthritis of left knee 04/08/2020   Restless leg syndrome    Right foot strain, initial encounter 10/30/2017   Sacroiliitis ([place]) 10/14/2019   Stage 3a chronic kidney disease ([place]) 04/08/2020   Subacromial bursitis of right shoulder joint 02/23/2018   Tailor's bunionette, right 07/02/2019   Thoracic disc herniation 07/05/2016    Past Surgical History:  Procedure Laterality Date   CARPAL TUNNEL RELEASE  2019   COLONOSCOPY     2018   ESOPHAGOGASTRODUODENOSCOPY  07/06/2016   Endoscopic finding suggestive of Barretts esophagus. Hiatal hernia, Mild gastritis.    FOOT FRACTURE SURGERY Right 02/2021   TUBAL LIGATION  2002   at least 2002-17 years ago    Family History  Problem Relation Age of Onset   Breast cancer Mother    Lymphoma Mother    Hypertension Mother    Cancer Father    Thyroid cancer Daughter    Colon cancer Neg Hx    Rectal cancer Neg Hx    Colon polyps Neg Hx    Esophageal cancer Neg Hx    Stomach  cancer Neg Hx     Social History   Tobacco Use   Smoking status: Former    Packs/day: 2.00    Years: 20.00    Total pack years: 40.00    Types: Cigarettes    Quit date: 2015    Years since quitting: 9.1   Smokeless tobacco: Never   Tobacco comments:    quit in 2002 started back and quit 2015  Vaping Use   Vaping Use: Never used  Substance Use Topics   Alcohol use: Not Currently   Drug use: Never    Current Outpatient Medications  Medication Sig Dispense Refill   albuterol (PROVENTIL HFA;VENTOLIN HFA) 108 (90 Base) MCG/ACT inhaler Inhale 1 puff into the lungs every 6 (six) hours as needed for wheezing or shortness of breath.  3   aspirin  EC 81 MG tablet Take 81 mg by mouth daily. Swallow whole.     budesonide-formoterol (SYMBICORT) 160-4.5 MCG/ACT inhaler Inhale 2 puffs into the lungs daily.     cetirizine (ZYRTEC) 10 MG tablet Take 10 mg by mouth daily.     Cholecalciferol 1.25 MG (50000 UT) capsule Take 50,000 Units by mouth once a week.     cloNIDine (CATAPRES) 0.2 MG tablet Take 0.2 mg by mouth daily.     cyclobenzaprine (FLEXERIL) 10 MG tablet Take 10 mg by mouth 3 (three) times daily.     lamoTRIgine (LAMICTAL) 150 MG tablet Take 150 mg by mouth in the morning and at bedtime.     Multiple Vitamin (MULTIVITAMIN) capsule Take 1 capsule by mouth daily.     olmesartan-hydrochlorothiazide (BENICAR HCT) 20-12.5 MG tablet Take 1 tablet by mouth daily.     pantoprazole (PROTONIX) 40 MG tablet Take 40 mg by mouth 2 (two) times daily.     pravastatin (PRAVACHOL) 80 MG tablet Take 80 mg by mouth daily.     PROZAC 40 MG capsule Take 40 mg by mouth every morning.     QUEtiapine (SEROQUEL) 300 MG tablet Take 300 mg by mouth at bedtime.     rOPINIRole (REQUIP) 0.5 MG tablet Take 0.5 mg by mouth at bedtime.     tiotropium (SPIRIVA) 18 MCG inhalation capsule Place 18 mcg into inhaler and inhale daily.     valACYclovir (VALTREX) 1000 MG tablet Take 1,000 mg by mouth 2 (two) times daily.      zolpidem (AMBIEN) 10 MG tablet Take 10 mg by mouth at bedtime.     ondansetron (ZOFRAN-ODT) 4 MG disintegrating tablet Take 4 mg by mouth as needed. (Patient not taking: Reported on 08/25/2022)     Current Facility-Administered Medications  Medication Dose Route Frequency Provider Last Rate Last Admin   0.9 %  sodium chloride infusion  500 mL Intravenous Once Jackquline Denmark, MD        Allergies  Allergen Reactions   Molds & Smuts Other (See Comments)    PER ALLERGY TEST   Other     Dust and mold    Latex Rash    Review of Systems:  neg     Physical Exam:    BP 118/78 (BP Location: Left Wrist, Patient Position: Sitting, Cuff Size: Normal)   Pulse 100   Ht '5\' 4"'$  (1.626 m)   Wt (!) 318 lb 2 oz (144.3 kg)   LMP 08/11/2022   BMI 54.61 kg/m  Filed Weights   08/25/22 1120  Weight: (!) 318 lb 2 oz (144.3 kg)   Constitutional:  Well-developed, in no acute distress.  Psychiatric: Normal mood and affect. Behavior is normal. HEENT: Pupils normal.  Conjunctivae are normal. No scleral icterus. Abdominal: Soft, nondistended. Bowel sounds active throughout. There are no masses palpable. No hepatomegaly. Rectal:  defered Neurological: Alert and oriented to person place and time. Skin: Skin is warm and dry. No rashes noted.  Right foot bandaged.  Carmell Austria, MD 08/25/2022, 11:30 AM  Cc: Heide Scales FNP

## 2022-08-25 NOTE — Patient Instructions (Addendum)
_______________________________________________________  If your blood pressure at your visit was 140/90 or greater, please contact your primary care physician to follow up on this.  _______________________________________________________  If you are age 47 or older, your body mass index should be between 23-30. Your Body mass index is 54.61 kg/m. If this is out of the aforementioned range listed, please consider follow up with your Primary Care Provider.  If you are age 24 or younger, your body mass index should be between 19-25. Your Body mass index is 54.61 kg/m. If this is out of the aformentioned range listed, please consider follow up with your Primary Care Provider.   ________________________________________________________  The [facility] providers would like to encourage you to use [facility] to communicate with providers for non-urgent requests or questions.  Due to long hold times on the telephone, sending your provider a message by [facility] may be a faster and more efficient way to get a response.  Please allow 48 business hours for a response.  Please remember that this is for non-urgent requests.  _______________________________________________________  Your provider has requested that you go to the basement level for lab work before leaving today. Press "B" on the elevator. The lab is located at the first door on the left as you exit the elevator.  Continue gluten free diet  Continue Protonix '40mg'$  2 times a day  Try to lose 6lbs over the next 3 months  You have been scheduled for an endoscopy and colonoscopy. Please follow the written instructions given to you at your visit today. Please pick up your prep supplies at the pharmacy within the next 1-3 days. If you use inhalers (even only as needed), please bring them with you on the day of your procedure.  You have been [facility]  [place] [id number] [address]   You are scheduled on 09-01-2022 at 4pm. You should arrive by 2pm prior to your appointment time for registration. Please follow the written instructions below on the day of your exam:  WARNING: IF YOU ARE ALLERGIC TO IODINE/X-RAY DYE, PLEASE NOTIFY RADIOLOGY IMMEDIATELY AT [phone]! YOU WILL BE GIVEN A 13 HOUR PREMEDICATION PREP.  1) Do not eat or drink anything after 12pm (4 hours prior to your test) 2) You will be given 2 bottles of oral contrast to drink on site. The solution may taste better if refrigerated, but do NOT add ice or any other liquid to this solution. Shake well before drinking.    Drink 1 bottle of contrast @ 2pm (2 hours prior to your exam)  Drink 1 bottle of contrast @ 3pm (1 hour prior to your exam)  You may take any medications as prescribed with a small amount of water, if necessary. If you take any of the following medications: METFORMIN, GLUCOPHAGE, GLUCOVANCE, AVANDAMET, RIOMET, FORTAMET, [place] MET, JANUMET, GLUMETZA or METAGLIP, you MAY be asked to HOLD this medication 48 hours AFTER the exam.  The purpose of you drinking the oral contrast is to aid in the visualization of your intestinal tract. The contrast solution may cause some diarrhea. Depending on your individual set of symptoms, you may also receive an intravenous injection of x-ray contrast/dye. Plan on being at [facility] for 30 minutes or longer, depending on the type of exam you are having performed.  This test typically takes 30-45 minutes to complete.  If you have any questions regarding your exam or  if you need to reschedule, you may call the CT department at [phone] between the hours of 8:00 am and 5:00 pm, Monday-Friday.  ________________________________________________________________________   Thank you,  Dr. Jackquline Denmark

## 2022-08-31 LAB — CELIAC PANEL 10
Antigliadin Abs, IgA: 39 units — ABNORMAL HIGH (ref 0–19)
Endomysial IgA: NEGATIVE
Gliadin IgG: 21 units — ABNORMAL HIGH (ref 0–19)
IgA/Immunoglobulin A, Serum: 120 mg/dL (ref 87–352)
Tissue Transglut Ab: 15 U/mL — ABNORMAL HIGH (ref 0–5)
Transglutaminase IgA: <2 U/mL (ref 0–3)

## 2022-09-01 ENCOUNTER — Ambulatory Visit (HOSPITAL_BASED_OUTPATIENT_CLINIC_OR_DEPARTMENT_OTHER)
Admission: RE | Admit: 2022-09-01 | Discharge: 2022-09-01 | Disposition: A | Discharge status: Home / Self Care | Payer: Medicare HMO | Source: Ambulatory Visit | Attending: Gastroenterology | Admitting: Gastroenterology

## 2022-09-01 DIAGNOSIS — K760 Fatty (change of) liver, not elsewhere classified: Secondary | ICD-10-CM | POA: Insufficient documentation

## 2022-09-01 DIAGNOSIS — R1011 Right upper quadrant pain: Secondary | ICD-10-CM | POA: Insufficient documentation

## 2022-09-01 DIAGNOSIS — K900 Celiac disease: Secondary | ICD-10-CM | POA: Diagnosis present

## 2022-09-01 DIAGNOSIS — D509 Iron deficiency anemia, unspecified: Secondary | ICD-10-CM | POA: Insufficient documentation

## 2022-09-01 MED ORDER — IOHEXOL 300 MG/ML  SOLN
125.0000 mL | Freq: Once | INTRAMUSCULAR | Status: AC | PRN
  Administered 2022-09-01: 125 mL via INTRAVENOUS

## 2022-12-12 ENCOUNTER — Telehealth: Payer: Self-pay | Admitting: Gastroenterology

## 2022-12-12 ENCOUNTER — Encounter: Payer: Self-pay | Admitting: Gastroenterology

## 2022-12-12 NOTE — Telephone Encounter (Signed)
Patient will come to pick up instructions tomorrow she says. Instructions placed at front on the second floor.

## 2022-12-12 NOTE — Telephone Encounter (Signed)
Inbound call from patient stating she misplaced paperwork for procedure scheduled for 6/24. Requesting to come in tomorrow to pick up another copy. Please advise, thank you.

## 2022-12-18 NOTE — Anesthesia Preprocedure Evaluation (Signed)
Anesthesia Evaluation  Patient identified by MRN, date of birth, ID band Patient awake    Reviewed: Allergy & Precautions, NPO status , Patient's Chart, lab work & pertinent test results  History of Anesthesia Complications Negative for: history of anesthetic complications  Airway Mallampati: III  TM Distance: >3 FB Neck ROM: Full    Dental  (+) Dental Advisory Given   Pulmonary neg shortness of breath, asthma , neg sleep apnea, COPD,  COPD inhaler, neg recent URI, former smoker   Pulmonary exam normal breath sounds clear to auscultation       Cardiovascular hypertension (clonidine, olmesartan-HCTZ), Pt. on medications (-) angina (-) Past MI, (-) Cardiac Stents and (-) CABG (-) dysrhythmias  Rhythm:Regular Rate:Normal  HLD  TTE 05/12/2022: IMPRESSIONS    1. GLS -17.1. Left ventricular ejection fraction, by estimation, is 60 to  65%. The left ventricle has normal function. The left ventricle has no  regional wall motion abnormalities. Left ventricular diastolic parameters  were normal.   2. Right ventricular systolic function is normal. The right ventricular  size is normal.   3. The mitral valve is normal in structure. No evidence of mitral valve  regurgitation. No evidence of mitral stenosis.   4. The aortic valve is normal in structure. Aortic valve regurgitation is  not visualized. No aortic stenosis is present.   5. The inferior vena cava is normal in size with greater than 50%  respiratory variability, suggesting right atrial pressure of 3 mmHg.   Normal stress test 10/27/2021   Neuro/Psych neg Seizures PSYCHIATRIC DISORDERS (PTSD) Anxiety Depression     Neuromuscular disease (polyneuropathy, lumbar radiculopathy, cervical radiculopathy)    GI/Hepatic Bowel prep,GERD (Barrett's esophagus)  Medicated,,Fatty liver Celiac disease   Endo/Other  neg diabetes  Morbid obesity  Renal/GU CRFRenal disease      Musculoskeletal  (+) Arthritis , Osteoarthritis,    Abdominal  (+) + obese  Peds  Hematology  (+) Blood dyscrasia, anemia   Anesthesia Other Findings   Reproductive/Obstetrics                             Anesthesia Physical Anesthesia Plan  ASA: 3  Anesthesia Plan: MAC   Post-op Pain Management: Minimal or no pain anticipated   Induction: Intravenous  PONV Risk Score and Plan: 2 and Propofol infusion and Treatment may vary due to age or medical condition  Airway Management Planned: Natural Airway and Nasal Cannula  Additional Equipment:   Intra-op Plan:   Post-operative Plan:   Informed Consent: I have reviewed the patients History and Physical, chart, labs and discussed the procedure including the risks, benefits and alternatives for the proposed anesthesia with the patient or authorized representative who has indicated his/her understanding and acceptance.     Dental advisory given  Plan Discussed with: CRNA and Anesthesiologist  Anesthesia Plan Comments: (Discussed with patient risks of MAC including, but not limited to, minor pain or discomfort, hearing people in the room, and possible need for backup general anesthesia. Risks for general anesthesia also discussed including, but not limited to, sore throat, hoarse voice, chipped/damaged teeth, injury to vocal cords, nausea and vomiting, allergic reactions, lung infection, heart attack, stroke, and death. All questions answered. )       Anesthesia Quick Evaluation

## 2022-12-19 ENCOUNTER — Ambulatory Visit (HOSPITAL_BASED_OUTPATIENT_CLINIC_OR_DEPARTMENT_OTHER): Payer: Medicare HMO | Admitting: Anesthesiology

## 2022-12-19 ENCOUNTER — Encounter (HOSPITAL_COMMUNITY)
Admission: RE | Disposition: A | Discharge status: Home / Self Care | Payer: Self-pay | Source: Home / Self Care | Attending: Gastroenterology

## 2022-12-19 ENCOUNTER — Other Ambulatory Visit: Payer: Self-pay

## 2022-12-19 ENCOUNTER — Encounter (HOSPITAL_COMMUNITY): Payer: Self-pay | Admitting: Gastroenterology

## 2022-12-19 ENCOUNTER — Ambulatory Visit (HOSPITAL_COMMUNITY): Payer: Medicare HMO | Admitting: Anesthesiology

## 2022-12-19 ENCOUNTER — Ambulatory Visit (HOSPITAL_COMMUNITY)
Admission: RE | Admit: 2022-12-19 | Discharge: 2022-12-19 | Disposition: A | Discharge status: Home / Self Care | Payer: Medicare HMO | Attending: Gastroenterology | Admitting: Gastroenterology

## 2022-12-19 DIAGNOSIS — Z79899 Other long term (current) drug therapy: Secondary | ICD-10-CM | POA: Insufficient documentation

## 2022-12-19 DIAGNOSIS — K2270 Barrett's esophagus without dysplasia: Secondary | ICD-10-CM

## 2022-12-19 DIAGNOSIS — K2970 Gastritis, unspecified, without bleeding: Secondary | ICD-10-CM

## 2022-12-19 DIAGNOSIS — K2100 Gastro-esophageal reflux disease with esophagitis, without bleeding: Secondary | ICD-10-CM | POA: Insufficient documentation

## 2022-12-19 DIAGNOSIS — K640 First degree hemorrhoids: Secondary | ICD-10-CM | POA: Insufficient documentation

## 2022-12-19 DIAGNOSIS — Z6841 Body Mass Index (BMI) 40.0 and over, adult: Secondary | ICD-10-CM | POA: Insufficient documentation

## 2022-12-19 DIAGNOSIS — K317 Polyp of stomach and duodenum: Secondary | ICD-10-CM

## 2022-12-19 DIAGNOSIS — F419 Anxiety disorder, unspecified: Secondary | ICD-10-CM | POA: Diagnosis not present

## 2022-12-19 DIAGNOSIS — K2289 Other specified disease of esophagus: Secondary | ICD-10-CM

## 2022-12-19 DIAGNOSIS — D509 Iron deficiency anemia, unspecified: Secondary | ICD-10-CM

## 2022-12-19 DIAGNOSIS — M5412 Radiculopathy, cervical region: Secondary | ICD-10-CM | POA: Insufficient documentation

## 2022-12-19 DIAGNOSIS — K5730 Diverticulosis of large intestine without perforation or abscess without bleeding: Secondary | ICD-10-CM | POA: Diagnosis not present

## 2022-12-19 DIAGNOSIS — K2950 Unspecified chronic gastritis without bleeding: Secondary | ICD-10-CM | POA: Diagnosis not present

## 2022-12-19 DIAGNOSIS — G629 Polyneuropathy, unspecified: Secondary | ICD-10-CM | POA: Diagnosis not present

## 2022-12-19 DIAGNOSIS — K31A19 Gastric intestinal metaplasia without dysplasia, unspecified site: Secondary | ICD-10-CM | POA: Insufficient documentation

## 2022-12-19 DIAGNOSIS — Z1211 Encounter for screening for malignant neoplasm of colon: Secondary | ICD-10-CM

## 2022-12-19 DIAGNOSIS — K644 Residual hemorrhoidal skin tags: Secondary | ICD-10-CM | POA: Insufficient documentation

## 2022-12-19 DIAGNOSIS — R112 Nausea with vomiting, unspecified: Secondary | ICD-10-CM

## 2022-12-19 DIAGNOSIS — K760 Fatty (change of) liver, not elsewhere classified: Secondary | ICD-10-CM | POA: Diagnosis not present

## 2022-12-19 DIAGNOSIS — K449 Diaphragmatic hernia without obstruction or gangrene: Secondary | ICD-10-CM | POA: Insufficient documentation

## 2022-12-19 DIAGNOSIS — M1990 Unspecified osteoarthritis, unspecified site: Secondary | ICD-10-CM | POA: Insufficient documentation

## 2022-12-19 DIAGNOSIS — F32A Depression, unspecified: Secondary | ICD-10-CM | POA: Diagnosis not present

## 2022-12-19 DIAGNOSIS — K219 Gastro-esophageal reflux disease without esophagitis: Secondary | ICD-10-CM

## 2022-12-19 DIAGNOSIS — K900 Celiac disease: Secondary | ICD-10-CM | POA: Diagnosis not present

## 2022-12-19 DIAGNOSIS — F4310 Post-traumatic stress disorder, unspecified: Secondary | ICD-10-CM | POA: Diagnosis not present

## 2022-12-19 DIAGNOSIS — M5416 Radiculopathy, lumbar region: Secondary | ICD-10-CM | POA: Insufficient documentation

## 2022-12-19 DIAGNOSIS — Z87891 Personal history of nicotine dependence: Secondary | ICD-10-CM | POA: Diagnosis not present

## 2022-12-19 DIAGNOSIS — I129 Hypertensive chronic kidney disease with stage 1 through stage 4 chronic kidney disease, or unspecified chronic kidney disease: Secondary | ICD-10-CM | POA: Diagnosis not present

## 2022-12-19 DIAGNOSIS — K319 Disease of stomach and duodenum, unspecified: Secondary | ICD-10-CM | POA: Diagnosis not present

## 2022-12-19 DIAGNOSIS — F418 Other specified anxiety disorders: Secondary | ICD-10-CM

## 2022-12-19 DIAGNOSIS — R1011 Right upper quadrant pain: Secondary | ICD-10-CM

## 2022-12-19 DIAGNOSIS — N1831 Chronic kidney disease, stage 3a: Secondary | ICD-10-CM | POA: Diagnosis not present

## 2022-12-19 DIAGNOSIS — J449 Chronic obstructive pulmonary disease, unspecified: Secondary | ICD-10-CM | POA: Diagnosis not present

## 2022-12-19 HISTORY — PX: ESOPHAGOGASTRODUODENOSCOPY (EGD) WITH PROPOFOL: SHX5813

## 2022-12-19 HISTORY — PX: BIOPSY: SHX5522

## 2022-12-19 HISTORY — PX: COLONOSCOPY WITH PROPOFOL: SHX5780

## 2022-12-19 HISTORY — PX: POLYPECTOMY: SHX5525

## 2022-12-19 SURGERY — COLONOSCOPY WITH PROPOFOL
Anesthesia: Monitor Anesthesia Care

## 2022-12-19 MED ORDER — PHENYLEPHRINE 80 MCG/ML (10ML) SYRINGE FOR IV PUSH (FOR BLOOD PRESSURE SUPPORT)
PREFILLED_SYRINGE | INTRAVENOUS | Status: DC | PRN
  Administered 2022-12-19: 80 ug via INTRAVENOUS
  Administered 2022-12-19: 160 ug via INTRAVENOUS
  Administered 2022-12-19 (×2): 80 ug via INTRAVENOUS

## 2022-12-19 MED ORDER — LACTATED RINGERS IV SOLN: INTRAVENOUS | Status: DC

## 2022-12-19 MED ORDER — PROPOFOL 10 MG/ML IV BOLUS
INTRAVENOUS | Status: DC | PRN
  Administered 2022-12-19: 50 mg via INTRAVENOUS
  Administered 2022-12-19: 20 mg via INTRAVENOUS
  Administered 2022-12-19 (×2): 30 mg via INTRAVENOUS

## 2022-12-19 MED ORDER — SODIUM CHLORIDE 0.9 % IV SOLN: INTRAVENOUS | Status: DC

## 2022-12-19 MED ORDER — PROPOFOL 500 MG/50ML IV EMUL
INTRAVENOUS | Status: DC | PRN
  Administered 2022-12-19: 150 ug/kg/min via INTRAVENOUS

## 2022-12-19 SURGICAL SUPPLY — 25 items

## 2022-12-19 NOTE — Anesthesia Procedure Notes (Signed)
Procedure Name: MAC Date/Time: 12/19/2022 7:39 AM  Performed by: Caren Macadam, CRNAPre-anesthesia Checklist: Emergency Drugs available, Suction available, Patient being monitored, Timeout performed and Patient identified Patient Re-evaluated:Patient Re-evaluated prior to induction Oxygen Delivery Method: Simple face mask Preoxygenation: Pre-oxygenation with 100% oxygen

## 2022-12-19 NOTE — Anesthesia Postprocedure Evaluation (Signed)
Anesthesia Post Note  Patient: Stacey French  Procedure(s) Performed: COLONOSCOPY WITH PROPOFOL ESOPHAGOGASTRODUODENOSCOPY (EGD) WITH PROPOFOL BIOPSY     Patient location during evaluation: PACU Anesthesia Type: MAC Level of consciousness: awake Pain management: pain level controlled Vital Signs Assessment: post-procedure vital signs reviewed and stable Respiratory status: spontaneous breathing, nonlabored ventilation and respiratory function stable Cardiovascular status: stable and blood pressure returned to baseline Postop Assessment: no apparent nausea or vomiting Anesthetic complications: no   No notable events documented.  Last Vitals:  Vitals:   12/19/22 0840 12/19/22 0850  BP: 124/65 123/66  Pulse: 80 82  Resp: 15 15  Temp:    SpO2: 98% 99%    Last Pain:  Vitals:   12/19/22 0850  TempSrc:   PainSc: 0-No pain                 Linton Rump

## 2022-12-19 NOTE — Discharge Instructions (Signed)
YOU HAD AN ENDOSCOPIC PROCEDURE TODAY: Refer to the procedure report and other information in the discharge instructions given to you for any specific questions about what was found during the examination. If this information does not answer your questions, please call [place] office at [phone] to clarify.  ° °YOU SHOULD EXPECT: Some feelings of bloating in the abdomen. Passage of more gas than usual. Walking can help get rid of the air that was put into your GI tract during the procedure and reduce the bloating. If you had a lower endoscopy (such as a colonoscopy or flexible sigmoidoscopy) you may notice spotting of blood in your stool or on the toilet paper. Some abdominal soreness may be present for a day or two, also. ° °DIET: Your first meal following the procedure should be a light meal and then it is ok to progress to your normal diet. A half-sandwich or bowl of soup is an example of a good first meal. Heavy or fried foods are harder to digest and may make you feel nauseous or bloated. Drink plenty of fluids but you should avoid alcoholic beverages for 24 hours. If you had a esophageal dilation, please see attached instructions for diet.   ° °ACTIVITY: Your care partner should take you home directly after the procedure. You should plan to take it easy, moving slowly for the rest of the day. You can resume normal activity the day after the procedure however YOU SHOULD NOT DRIVE, use power tools, machinery or perform tasks that involve climbing or major physical exertion for 24 hours (because of the sedation medicines used during the test).  ° °SYMPTOMS TO REPORT IMMEDIATELY: °A gastroenterologist can be reached at any hour. Please call [phone]  for any of the following symptoms:  °Following lower endoscopy (colonoscopy, flexible sigmoidoscopy) °Excessive amounts of blood in the stool  °Significant tenderness, worsening of abdominal pains  °Swelling of the abdomen that is new, acute  °Fever of 100° or  higher  °Following upper endoscopy (EGD, EUS, ERCP, esophageal dilation) °Vomiting of blood or coffee ground material  °New, significant abdominal pain  °New, significant chest pain or pain under the shoulder blades  °Painful or persistently difficult swallowing  °New shortness of breath  °Black, tarry-looking or red, bloody stools ° °FOLLOW UP:  °If any biopsies were taken you will be contacted by phone or by letter within the next 1-3 weeks. Call [phone]  if you have not heard about the biopsies in 3 weeks.  °Please also call with any specific questions about appointments or follow up tests. ° °

## 2022-12-19 NOTE — Transfer of Care (Signed)
Immediate Anesthesia Transfer of Care Note  Patient: CANDANCE BOHLMAN  Procedure(s) Performed: COLONOSCOPY WITH PROPOFOL ESOPHAGOGASTRODUODENOSCOPY (EGD) WITH PROPOFOL BIOPSY  Patient Location: Endoscopy Unit  Anesthesia Type:MAC  Level of Consciousness: awake  Airway & Oxygen Therapy: Patient Spontanous Breathing and Patient connected to face mask oxygen  Post-op Assessment: Report given to RN and Post -op Vital signs reviewed and stable  Post vital signs: Reviewed and stable  Last Vitals:  Vitals Value Taken Time  BP    Temp    Pulse    Resp    SpO2      Last Pain:  Vitals:   12/19/22 0649  TempSrc: Temporal  PainSc: 0-No pain         Complications: No notable events documented.

## 2022-12-19 NOTE — H&P (Signed)
Chief Complaint: Follow-up   Referring Provider: Mauricio Po FNP        ASSESSMENT AND PLAN;      #1.  RUQ pain with nausea. Occ LLQ pain #2.  Celiac disease  #3.  GERD with small HH with Barrett's esophagus. #4.  H/O Abn LFTs (nl now) d/t fatty liver . R/O other causes. #5.  IDA likely d/t GYN causes.   Plan: - Continue gluten free diet.  - Rpt EGD for Barretts/Colon with miralax - Continue Protonix 40mg  po BID     For EGD/colon today   I discussed EGD/Colonoscopy- the indications, risks, alternatives and potential complications including, but not limited to bleeding, infection, reaction to meds, damage to internal organs, cardiac and/or pulmonary problems, and perforation requiring surgery. The possibility that significant findings could be missed was explained. All ? were answered. Pt consents to proceed. HPI:     Stacey French is a 47 y.o. female    For follow-up visit.   RUQ pain x 6 months Shooting Not related to eating But feels full after eating Has nausea but no vomiting Associated with back pain as well. Started all of a sudden when she was trying to get out of bed. No jaundice dark urine or pale stools.   Also with occasional left lower quadrant pain   No heartburn, regurgitation, odynophagia or dysphagia.  No significant diarrhea or constipation.  No melena or hematochezia. No unintentional weight loss.    Has been compliant with gluten-free diet.  Also does not want to reduce Protonix as she has breakthrough symptoms if she does not take it twice a day.   Has been advised to get EGD/colonoscopy due to persistent IDA.  Most recent hemoglobin was 12 but it fluctuates. No NSAIDs   Has gained weight as below.       Latest Ref Rng & Units 04/27/2021    1:10 PM 03/30/2018   10:23 AM  CBC  WBC 4.0 - 10.5 K/uL 6.8  4.6   Hemoglobin 12.0 - 15.0 g/dL 16.1  09.6   Hematocrit 36.0 - 46.0 % 36.9  32.5   Platelets 150.0 - 400.0 K/uL 312.0  295.0        Used to have heavy menstrual cycles 1 yr ago. Not bad now.          Wt Readings from Last 3 Encounters:  08/25/22 (!) 318 lb 2 oz (144.3 kg)  06/14/22 (!) 307 lb 3.2 oz (139.3 kg)  12/16/21 296 lb (134.3 kg)        Past GI procedures:   EGD 07/06/2016-Barrett's eso without dysplasia, small HH, SB Bx - IE lymphocytosis with villous blunting).  Celiac screen- positive.  EGD 10/2019 -Long segment Barrett's esophagus. (Bx-Barrett's without dysplasia). -Small hiatal hernia. -Duodenal mucosa. Normal (bx-no villous atrophy)   Colonoscopy 06/2018 - Few ulcers at the ileocecal valve. ? Importance, (biopsied to rule out early Crohn's disease) - Otherwise normal colonoscopy to TI (was somewhat limited due to quality of preparation -Bx: 1. Surgical [P], terminal ileum - BENIGN SMALL BOWEL MUCOSA. - NO VILLOUS ATROPHY, INFLAMMATION OR OTHER ABNORMALITIES PRESENT. 2. Surgical [P], ileocecal valve - SEVERELY ACTIVE COLITIS WITH ULCERATION. - FRAGMENTS OF ADIPOSE TISSUE. - SEE COMMENT. 3. Surgical [P], random sites - LYMPHOCYTIC COLITIS.   HIDA with EF 09/2019: Nl.  Borderline EF 32% with Ensure   Neg RUQ [place] 01/03/2019 except for fatty liver.   Neg CTA (chest/A/P) 12/2018: neg  Past Medical History:  Diagnosis Date   Allergy      SEASONAL   Anemia     Anxiety     Arthritis     Arthritis of metatarsophalangeal joint 11/28/2016   Asthma     Barrett's esophagus without dysplasia     Bilateral carpal tunnel syndrome 08/15/2016   Bright red blood per rectum 11/27/2015   Bunion, right 12/26/2016   Celiac disease     Cervical high risk HPV (human papillomavirus) test positive 11/03/2016    Formatting of this note might be different from the original.  Pap 5/18- negative, high risk HPV positive (16&18 negative).  Plan to repeat pap in 12 months   Cervical radiculopathy 06/06/2016   Chronic midline low back pain without sciatica 07/08/2016   Chronic neck pain 07/08/2016    COPD (chronic obstructive pulmonary disease) (HCC)     DDD (degenerative disc disease), cervical 06/06/2016   De Quervain's disease (tenosynovitis) 08/12/2019   Elevated cholesterol     Essential hypertension 11/27/2015   Freiberg's infraction, right 07/02/2019   Generalized anxiety disorder 02/12/2015   GERD (gastroesophageal reflux disease)     Heartburn     Hypertension     IBS (irritable bowel syndrome)     Lumbar degenerative disc disease 06/06/2016   Lumbar pars defect 06/06/2016   Lumbar radiculopathy 06/06/2016   Major depressive disorder, recurrent episode, severe (HCC) 02/12/2015   Morbid obesity, unspecified obesity type (HCC) 02/23/2018   Obesity     Pneumonia     Polyneuropathy     Posttraumatic stress disorder 02/12/2015   Primary osteoarthritis of left knee 04/08/2020   Restless leg syndrome     Right foot strain, initial encounter 10/30/2017   Sacroiliitis (HCC) 10/14/2019   Stage 3a chronic kidney disease (HCC) 04/08/2020   Subacromial bursitis of right shoulder joint 02/23/2018   Tailor's bunionette, right 07/02/2019   Thoracic disc herniation 07/05/2016           Past Surgical History:  Procedure Laterality Date   CARPAL TUNNEL RELEASE   2019   COLONOSCOPY        2018   ESOPHAGOGASTRODUODENOSCOPY   07/06/2016    Endoscopic finding suggestive of Barretts esophagus. Hiatal hernia, Mild gastritis.    FOOT FRACTURE SURGERY Right 02/2021   TUBAL LIGATION   2002    at least 2002-17 years ago           Family History  Problem Relation Age of Onset   Breast cancer Mother     Lymphoma Mother     Hypertension Mother     Cancer Father     Thyroid cancer Daughter     Colon cancer Neg Hx     Rectal cancer Neg Hx     Colon polyps Neg Hx     Esophageal cancer Neg Hx     Stomach cancer Neg Hx        Social History         Tobacco Use   Smoking status: Former      Packs/day: 2.00      Years: 20.00      Total pack years: 40.00      Types: Cigarettes       Quit date: 2015      Years since quitting: 9.1   Smokeless tobacco: Never   Tobacco comments:      quit in 2002 started back and quit 2015  Vaping Use   Vaping Use: Never used  Substance Use Topics   Alcohol use: Not Currently   Drug use: Never            Current Outpatient Medications  Medication Sig Dispense Refill   albuterol (PROVENTIL HFA;VENTOLIN HFA) 108 (90 Base) MCG/ACT inhaler Inhale 1 puff into the lungs every 6 (six) hours as needed for wheezing or shortness of breath.   3   aspirin EC 81 MG tablet Take 81 mg by mouth daily. Swallow whole.       budesonide-formoterol (SYMBICORT) 160-4.5 MCG/ACT inhaler Inhale 2 puffs into the lungs daily.       cetirizine (ZYRTEC) 10 MG tablet Take 10 mg by mouth daily.       Cholecalciferol 1.25 MG (50000 UT) capsule Take 50,000 Units by mouth once a week.       cloNIDine (CATAPRES) 0.2 MG tablet Take 0.2 mg by mouth daily.       cyclobenzaprine (FLEXERIL) 10 MG tablet Take 10 mg by mouth 3 (three) times daily.       lamoTRIgine (LAMICTAL) 150 MG tablet Take 150 mg by mouth in the morning and at bedtime.       Multiple Vitamin (MULTIVITAMIN) capsule Take 1 capsule by mouth daily.       olmesartan-hydrochlorothiazide (BENICAR HCT) 20-12.5 MG tablet Take 1 tablet by mouth daily.       pantoprazole (PROTONIX) 40 MG tablet Take 40 mg by mouth 2 (two) times daily.       pravastatin (PRAVACHOL) 80 MG tablet Take 80 mg by mouth daily.       PROZAC 40 MG capsule Take 40 mg by mouth every morning.       QUEtiapine (SEROQUEL) 300 MG tablet Take 300 mg by mouth at bedtime.       rOPINIRole (REQUIP) 0.5 MG tablet Take 0.5 mg by mouth at bedtime.       tiotropium (SPIRIVA) 18 MCG inhalation capsule Place 18 mcg into inhaler and inhale daily.       valACYclovir (VALTREX) 1000 MG tablet Take 1,000 mg by mouth 2 (two) times daily.       zolpidem (AMBIEN) 10 MG tablet Take 10 mg by mouth at bedtime.       ondansetron (ZOFRAN-ODT) 4 MG  disintegrating tablet Take 4 mg by mouth as needed. (Patient not taking: Reported on 08/25/2022)                 Current Facility-Administered Medications  Medication Dose Route Frequency Provider Last Rate Last Admin   0.9 %  sodium chloride infusion  500 mL Intravenous Once Lynann Bologna, MD               Allergies  Allergen Reactions   Molds & Smuts Other (See Comments)      PER ALLERGY TEST   Other        Dust and mold    Latex Rash      Review of Systems:  neg       Physical Exam:     BP 118/78 (BP Location: Left Wrist, Patient Position: Sitting, Cuff Size: Normal)   Pulse 100   Ht 5\' 4"  (1.626 m)   Wt (!) 318 lb 2 oz (144.3 kg)   LMP 08/11/2022   BMI 54.61 kg/m     Filed Weights    08/25/22 1120  Weight: (!) 318 lb 2 oz (144.3 kg)    Constitutional:  Well-developed, in no acute distress.  Psychiatric: Normal mood and affect. Behavior  is normal. HEENT: Pupils normal.  Conjunctivae are normal. No scleral icterus. Abdominal: Soft, nondistended. Bowel sounds active throughout. There are no masses palpable. No hepatomegaly. Rectal:  defered Neurological: Alert and oriented to person place and time. Skin: Skin is warm and dry. No rashes noted.  Right foot bandaged.  For EGD/colon today  Edman Circle, MD [facility] [phone]

## 2022-12-19 NOTE — Op Note (Signed)
[facility] Patient Name: Stacey French Procedure Date: 12/19/2022 MRN: [id number] Attending MD: Lynann Bologna , MD, [id number] Date of Birth: 24-Mar-1976 CSN: [id number] Age: 47 Admit Type: Outpatient Procedure:                Upper GI endoscopy Indications:              Iron deficiency anemia in pt with H/O celiac                            disease. Neg CT AP with contrast Providers:                Lynann Bologna, MD, Norman Clay, RN, Faustina Mbumina,                            Technician, Eliberto Ivory, RN Referring MD:             Drusilla Kanner FNP Medicines:                Monitored Anesthesia Care Complications:            No immediate complications. Estimated Blood Loss:     Estimated blood loss: none. Procedure:                Pre-Anesthesia Assessment:                           - Prior to the procedure, a History and Physical                            was performed, and patient medications and                            allergies were reviewed. The patient's tolerance of                            previous anesthesia was also reviewed. The risks                            and benefits of the procedure and the sedation                            options and risks were discussed with the patient.                            All questions were answered, and informed consent                            was obtained. Prior Anticoagulants: The patient has                            taken no anticoagulant or antiplatelet agents. ASA                            Grade Assessment: II - A patient with mild systemic  disease. After reviewing the risks and benefits,                            the patient was deemed in satisfactory condition to                            undergo the procedure.                           After obtaining informed consent, the endoscope was                            passed under direct vision. Throughout the                             procedure, the patient's blood pressure, pulse, and                            oxygen saturations were monitored continuously. The                            [id number]-[id number] ([id number]) Olympus endoscope was introduced                            through the mouth, and advanced to the second part                            of duodenum. The upper GI endoscopy was                            accomplished without difficulty. The patient                            tolerated the procedure well. Scope In: Scope Out: Findings:      The esophagus and gastroesophageal junction were examined with white       light and narrow band imaging (NBI) from a forward view and retroflexed       position. There were esophageal mucosal changes consistent with       long-segment Barrett's esophagus. These changes involved the mucosa at       the upper extent of the gastric folds (34 cm from the incisors)       extending to the Z-line (30 cm from the incisors). Salmon-colored mucosa       was present with few islands of whitish mucosa. The maximum longitudinal       extent of these esophageal mucosal changes was 4 cm in length. Mucosa       was biopsied with a cold forceps for histology every 1 cm.      A 2 cm hiatal hernia was present extending from 34 up to 36 cm.      Localized mild inflammation was found in the gastric body. Biopsies were       taken with a cold forceps for histology.      A few 2 to 4 mm sessile polyps with no bleeding and no stigmata of       recent  bleeding were found in the gastric body. These polyps were       removed with a cold biopsy forceps. Resection and retrieval were       complete.      The examined duodenum was normal. Biopsies for histology were taken with       a cold forceps for evaluation of celiac disease. Impression:               - Esophageal mucosal changes consistent with                            long-segment Barrett's esophagus. Biopsied.                           -  2 cm hiatal hernia.                           - Gastritis. Biopsied.                           - A few gastric polyps. Resected and retrieved.                           - Normal examined duodenum. Biopsied. Moderate Sedation:      Not Applicable - Patient had care per Anesthesia. Recommendation:           - Patient has a contact number available for                            emergencies. The signs and symptoms of potential                            delayed complications were discussed with the                            patient. Return to normal activities tomorrow.                            Written discharge instructions were provided to the                            patient.                           - Resume previous strict gluten-free diet.                           - Continue present medications including Protonix                            40 mg twice daily.                           - Await pathology results.                           - No aspirin, ibuprofen, naproxen, or other  non-steroidal anti-inflammatory drugs.                           - FU GI in 12-18 weeks.                           - The findings and recommendations were discussed                            with the patient's family. Procedure Code(s):        --- Professional ---                           [phone], Esophagogastroduodenoscopy, flexible,                            transoral; with biopsy, single or multiple Diagnosis Code(s):        --- Professional ---                           K22.89, Other specified disease of esophagus                           K44.9, Diaphragmatic hernia without obstruction or                            gangrene                           K29.70, Gastritis, unspecified, without bleeding                           K31.7, Polyp of stomach and duodenum                           D50.9, Iron deficiency anemia, unspecified CPT copyright 2022 American Medical  Association. All rights reserved. The codes documented in this report are preliminary and upon coder review may  be revised to meet current compliance requirements. Lynann Bologna, MD 12/19/2022 8:35:42 AM This report has been signed electronically. Number of Addenda: 0

## 2022-12-19 NOTE — Op Note (Signed)
Stacey French Patient Name: Stacey French Procedure Date: 12/19/2022 MRN: [id number] Attending MD: Stacey French , MD, [id number] Date of Birth: 03-07-1976 CSN: [id number] Age: 47 Admit Type: Outpatient Procedure:                Colonoscopy Indications:              Iron deficiency anemia- likely d/t gyn causes. Providers:                Stacey Bologna, MD, Stacey Clay, RN, Stacey Ivory, RN,                            Stacey French, Technician Referring MD:             Stacey Kanner NP Medicines:                Monitored Anesthesia Care Complications:            No immediate complications. Estimated Blood Loss:     Estimated blood loss: none. Procedure:                Pre-Anesthesia Assessment:                           - Prior to the procedure, a History and Physical                            was performed, and patient medications and                            allergies were reviewed. The patient's tolerance of                            previous anesthesia was also reviewed. The risks                            and benefits of the procedure and the sedation                            options and risks were discussed with the patient.                            All questions were answered, and informed consent                            was obtained. Prior Anticoagulants: The patient has                            taken no anticoagulant or antiplatelet agents. ASA                            Grade Assessment: II - A patient with mild systemic                            disease. After reviewing the risks and benefits,  the patient was deemed in satisfactory condition to                            undergo the procedure.                           After obtaining informed consent, the colonoscope                            was passed under direct vision. Throughout the                            procedure, the patient's blood pressure, pulse, and                             oxygen saturations were monitored continuously. The                            [id number] ([id number]) Olympus colonoscope was                            introduced through the anus and advanced to the 2                            cm into the ileum. The colonoscopy was performed                            without difficulty. The patient tolerated the                            procedure well. The quality of the bowel                            preparation was good except in some areas of the                            right colon where there was adherent stool.                            Aggressive suctioning aspiration was performed.                            Overall the examination was adequate. The terminal                            ileum, ileocecal valve, appendiceal orifice, and                            rectum were photographed. Scope In: 8:06:35 AM Scope Out: 8:19:46 AM Scope Withdrawal Time: 0 hours 9 minutes 51 seconds  Total Procedure Duration: 0 hours 13 minutes 11 seconds  Findings:      A few rare small-mouthed diverticula were found in the sigmoid colon.      Non-bleeding external and internal hemorrhoids were found during  retroflexion and during perianal exam. The hemorrhoids were Grade I       (internal hemorrhoids that do not prolapse).      The terminal ileum appeared normal.      The exam was otherwise without abnormality on direct and retroflexion       views. We decided to hold off on random colon biopsies since Stacey French was       not having any diarrhea. Impression:               - Minimal sigmoid diverticulosis                           - Non-bleeding external and internal hemorrhoids.                           - The examined portion of the ileum was normal.                           - The examination was otherwise normal on direct                            and retroflexion views.                           - No specimens  collected. Moderate Sedation:      Not Applicable - Patient had care per Anesthesia. Recommendation:           - Patient has a contact number available for                            emergencies. The signs and symptoms of potential                            delayed complications were discussed with the                            patient. Return to normal activities tomorrow.                            Written discharge instructions were provided to the                            patient.                           - Resume previous diet.                           - Continue present medications.                           - Repeat colonoscopy in 10 years for screening                            purposes. Earlier, if with any new problems or  change in family history                           - The findings and recommendations were discussed                            with the patient's family. Procedure Code(s):        --- Professional ---                           [phone], Colonoscopy, flexible; diagnostic, including                            collection of specimen(s) by brushing or washing,                            when performed (separate procedure) Diagnosis Code(s):        --- Professional ---                           K64.0, First degree hemorrhoids                           D50.9, Iron deficiency anemia, unspecified                           K57.30, Diverticulosis of large intestine without                            perforation or abscess without bleeding CPT copyright 2022 American Medical Association. All rights reserved. The codes documented in this report are preliminary and upon coder review may  be revised to meet current compliance requirements. Stacey Bologna, MD 12/19/2022 8:28:46 AM This report has been signed electronically. Number of Addenda: 0

## 2022-12-20 ENCOUNTER — Encounter: Payer: Self-pay | Admitting: Diagnostic Neuroimaging

## 2022-12-20 ENCOUNTER — Ambulatory Visit: Payer: Medicare HMO | Admitting: Diagnostic Neuroimaging

## 2022-12-20 VITALS — BP 131/77 | HR 90 | Ht 65.0 in | Wt 316.0 lb

## 2022-12-20 DIAGNOSIS — R519 Headache, unspecified: Secondary | ICD-10-CM

## 2022-12-20 DIAGNOSIS — G44209 Tension-type headache, unspecified, not intractable: Secondary | ICD-10-CM | POA: Diagnosis not present

## 2022-12-20 DIAGNOSIS — R4789 Other speech disturbances: Secondary | ICD-10-CM | POA: Diagnosis not present

## 2022-12-20 DIAGNOSIS — F988 Other specified behavioral and emotional disorders with onset usually occurring in childhood and adolescence: Secondary | ICD-10-CM | POA: Diagnosis not present

## 2022-12-20 LAB — SURGICAL PATHOLOGY

## 2022-12-20 NOTE — Patient Instructions (Signed)
  MILD WORD FINDING DIFFICULTIES - could be related to history of attention deficit disorder (off meds x 1 year); also with anxiety, insomnia, depression, on medications - follow up with psychiatry and PCP  RIGHT OCCIPITAL HEADACHE  - likely related to muscle tension - consider low dose ibuprofen, tylenol as needed - To prevent or relieve headaches, try the following: Cool Compress. Lie down and place a cool compress on your head.   Avoid headache triggers. If certain foods or odors seem to have triggered your migraines in the past, avoid them. A headache diary might help you identify triggers.   Include physical activity in your daily routine.  Manage stress. Find healthy ways to cope with the stressors, such as delegating tasks on your to-do list.   Practice relaxation techniques. Try deep breathing, yoga, massage and visualization.   Eat regularly. Eating regularly scheduled meals and maintaining a healthy diet might help prevent headaches. Also, drink plenty of fluids.   Follow a regular sleep schedule. Sleep deprivation might contribute to headaches Consider biofeedback. With this mind-body technique, you learn to control certain bodily functions -- such as muscle tension, heart rate and blood pressure -- to prevent headaches or reduce headache pain.

## 2022-12-20 NOTE — Progress Notes (Signed)
[facility]  PATIENT: Stacey French DOB: 06-18-1976  REFERRING CLINICIAN: Erskine Emery, NP HISTORY FROM: patient  REASON FOR VISIT: new consult   HISTORICAL  CHIEF COMPLAINT:  Chief Complaint  Patient presents with   New Patient (Initial Visit)    Rm 7,  here with husband Parks Pt referred for cognitive function evaluation. Has noticed some changes in memory. Trouble finding words at times. Pt also c/o a pain in the posterior aspect of the head that has been going for years.    HISTORY OF PRESENT ILLNESS:   47 year old female here for evaluation of mild cognitive impairment symptoms.  For past 3 months patient had some mild intermittent word finding difficulties, losing train of thought, during conversations.  No major memory lapses.  Has not had any episodes of forgetting recent events, conversations, appointments.  No change in ADLs.  Patient does have history of attention deficit disorder diagnosed in 2015, previously on Ritalin.  Patient was taken off of ADD medicines about 1 year ago for some reason, but patient does not know why.  Patient also has history of pain/discomfort in the back of her head on the right occipital region since 2011.  She has a constant pressure sensation as well as flareups a few times a week lasting hours or days at a time.  No nausea or vomiting.  No sensitive to light or sound.  No history of migraine.  She does have some neck pain issues.    REVIEW OF SYSTEMS: Full 14 system review of systems performed and negative with exception of: as per HPI.  ALLERGIES: Allergies  Allergen Reactions   Molds & Smuts Other (See Comments)    PER ALLERGY TEST   Other     Dust and mold    Latex Rash    HOME MEDICATIONS: Outpatient Medications Prior to Visit  Medication Sig Dispense Refill   albuterol (PROVENTIL HFA;VENTOLIN HFA) 108 (90 Base) MCG/ACT inhaler Inhale 1 puff into the lungs every 6 (six) hours as needed for wheezing  or shortness of breath.  3   aspirin EC 81 MG tablet Take 81 mg by mouth in the morning. Swallow whole.     cetirizine (ZYRTEC) 10 MG tablet Take 10 mg by mouth every evening.     Cholecalciferol 1.25 MG (50000 UT) capsule Take 50,000 Units by mouth every Sunday.     cloNIDine (CATAPRES) 0.2 MG tablet Take 0.2 mg by mouth at bedtime.     cyclobenzaprine (FLEXERIL) 10 MG tablet Take 10 mg by mouth 3 (three) times daily.     lamoTRIgine (LAMICTAL) 150 MG tablet Take 150 mg by mouth in the morning and at bedtime.     olmesartan-hydrochlorothiazide (BENICAR HCT) 20-12.5 MG tablet Take 1 tablet by mouth in the morning.     pantoprazole (PROTONIX) 40 MG tablet Take 40 mg by mouth 2 (two) times daily.     pravastatin (PRAVACHOL) 80 MG tablet Take 80 mg by mouth in the morning.     PROZAC 40 MG capsule Take 40 mg by mouth every morning.     QUEtiapine (SEROQUEL) 300 MG tablet Take 300 mg by mouth at bedtime.     rOPINIRole (REQUIP) 0.5 MG tablet Take 0.5 mg by mouth at bedtime.     tiotropium (SPIRIVA) 18 MCG inhalation capsule Place 18 mcg into inhaler and inhale daily.     valACYclovir (VALTREX) 1000 MG tablet Take 1 g by mouth 2 (two) times daily.  zolpidem (AMBIEN) 10 MG tablet Take 10 mg by mouth at bedtime.     Facility-Administered Medications Prior to Visit  Medication Dose Route Frequency Provider Last Rate Last Admin   0.9 %  sodium chloride infusion  500 mL Intravenous Once Lynann Bologna, MD        PAST MEDICAL HISTORY: Past Medical History:  Diagnosis Date   Allergy    SEASONAL   Anemia    Anxiety    Arthritis    Arthritis of metatarsophalangeal joint 11/28/2016   Asthma    Barrett's esophagus without dysplasia    Bilateral carpal tunnel syndrome 08/15/2016   Bright red blood per rectum 11/27/2015   Bunion, right 12/26/2016   Celiac disease    Cervical high risk HPV (human papillomavirus) test positive 11/03/2016   Formatting of this note might be different from the  original.  Pap 5/18- negative, high risk HPV positive (16&18 negative).  Plan to repeat pap in 12 months   Cervical radiculopathy 06/06/2016   Chronic midline low back pain without sciatica 07/08/2016   Chronic neck pain 07/08/2016   COPD (chronic obstructive pulmonary disease) (HCC)    DDD (degenerative disc disease), cervical 06/06/2016   De Quervain's disease (tenosynovitis) 08/12/2019   Elevated cholesterol    Essential hypertension 11/27/2015   Freiberg's infraction, right 07/02/2019   Generalized anxiety disorder 02/12/2015   GERD (gastroesophageal reflux disease)    Heartburn    Hypertension    IBS (irritable bowel syndrome)    Lumbar degenerative disc disease 06/06/2016   Lumbar pars defect 06/06/2016   Lumbar radiculopathy 06/06/2016   Major depressive disorder, recurrent episode, severe (HCC) 02/12/2015   Morbid obesity, unspecified obesity type (HCC) 02/23/2018   Obesity    Pneumonia    Polyneuropathy    Posttraumatic stress disorder 02/12/2015   Primary osteoarthritis of left knee 04/08/2020   Restless leg syndrome    Right foot strain, initial encounter 10/30/2017   Sacroiliitis (HCC) 10/14/2019   Stage 3a chronic kidney disease (HCC) 04/08/2020   Subacromial bursitis of right shoulder joint 02/23/2018   Tailor's bunionette, right 07/02/2019   Thoracic disc herniation 07/05/2016    PAST SURGICAL HISTORY: Past Surgical History:  Procedure Laterality Date   CARPAL TUNNEL RELEASE  2019   COLONOSCOPY     2018   ESOPHAGOGASTRODUODENOSCOPY  07/06/2016   Endoscopic finding suggestive of Barretts esophagus. Hiatal hernia, Mild gastritis.    FOOT FRACTURE SURGERY Right 02/2021   TUBAL LIGATION  2002   at least 2002-17 years ago    FAMILY HISTORY: Family History  Problem Relation Age of Onset   Breast cancer Mother    Lymphoma Mother    Hypertension Mother    Cancer Father    Thyroid cancer Daughter    Colon cancer Neg Hx    Rectal cancer Neg Hx    Colon  polyps Neg Hx    Esophageal cancer Neg Hx    Stomach cancer Neg Hx     SOCIAL HISTORY: Social History   Socioeconomic History   Marital status: Married    Spouse name: Not on file   Number of children: 3   Years of education: Not on file   Highest education level: Not on file  Occupational History   Occupation: disabled  Tobacco Use   Smoking status: Former    Packs/day: 2.00    Years: 20.00    Additional pack years: 0.00    Total pack years: 40.00    Types: Cigarettes  Quit date: 2015    Years since quitting: 9.4   Smokeless tobacco: Never   Tobacco comments:    quit in 2002 started back and quit 2015  Vaping Use   Vaping Use: Never used  Substance and Sexual Activity   Alcohol use: Not Currently   Drug use: Never   Sexual activity: Not on file  Other Topics Concern   Not on file  Social History Narrative   Not on file   Social Determinants of Health   Financial Resource Strain: Not on file  Food Insecurity: Not on file  Transportation Needs: Not on file  Physical Activity: Not on file  Stress: Not on file  Social Connections: Not on file  Intimate Partner Violence: Not on file     PHYSICAL EXAM  GENERAL EXAM/CONSTITUTIONAL: Vitals:  Vitals:   12/20/22 1059  BP: 131/77  Pulse: 90  Weight: (!) 316 lb (143.3 kg)  Height: 5\' 5"  (1.651 m)   Body mass index is 52.59 kg/m. Wt Readings from Last 3 Encounters:  12/20/22 (!) 316 lb (143.3 kg)  12/19/22 300 lb (136.1 kg)  08/25/22 (!) 318 lb 2 oz (144.3 kg)   Patient is in no distress; well developed, nourished and groomed; neck is supple  CARDIOVASCULAR: Examination of carotid arteries is normal; no carotid bruits Regular rate and rhythm, no murmurs Examination of peripheral vascular system by observation and palpation is normal  EYES: Ophthalmoscopic exam of optic discs and posterior segments is normal; no papilledema or hemorrhages No results found.  MUSCULOSKELETAL: Gait, strength, tone,  movements noted in Neurologic exam below  NEUROLOGIC: MENTAL STATUS:     12/20/2022   11:09 AM  MMSE - Mini Mental State Exam  Orientation to time 5  Orientation to Place 5  Registration 3  Attention/ Calculation 3  Recall 2  Language- name 2 objects 2  Language- repeat 1  Language- follow 3 step command 3  Language- read & follow direction 1  Write a sentence 1  Copy design 0  Total score 26   awake, alert, oriented to person, place and time recent and remote memory intact normal attention and concentration language fluent, comprehension intact, naming intact fund of knowledge appropriate  CRANIAL NERVE:  2nd - no papilledema on fundoscopic exam 2nd, 3rd, 4th, 6th - pupils equal and reactive to light, visual fields full to confrontation, extraocular muscles intact, no nystagmus 5th - facial sensation symmetric 7th - facial strength symmetric 8th - hearing intact 9th - palate elevates symmetrically, uvula midline 11th - shoulder shrug symmetric 12th - tongue protrusion midline  MOTOR:  normal bulk and tone, full strength in the BUE, BLE  SENSORY:  normal and symmetric to light touch, temperature, vibration  COORDINATION:  finger-nose-finger, fine finger movements normal  REFLEXES:  deep tendon reflexes present and symmetric  GAIT/STATION:  narrow based gait     DIAGNOSTIC DATA (LABS, IMAGING, TESTING) - I reviewed patient records, labs, notes, testing and imaging myself where available.  Lab Results  Component Value Date   WBC 7.3 08/25/2022   HGB 14.1 08/25/2022   HCT 41.4 08/25/2022   MCV 89.3 08/25/2022   PLT 259.0 08/25/2022      Component Value Date/Time   NA 135 08/25/2022 1217   K 4.0 08/25/2022 1217   CL 100 08/25/2022 1217   CO2 23 08/25/2022 1217   GLUCOSE 120 (H) 08/25/2022 1217   BUN 18 08/25/2022 1217   CREATININE 0.86 08/25/2022 1217   CALCIUM 9.6  08/25/2022 1217   PROT 6.9 08/25/2022 1217   ALBUMIN 4.0 08/25/2022 1217   AST  18 08/25/2022 1217   ALT 25 08/25/2022 1217   ALKPHOS 80 08/25/2022 1217   BILITOT 0.4 08/25/2022 1217   No results found for: "CHOL", "HDL", "LDLCALC", "LDLDIRECT", "TRIG", "CHOLHDL" No results found for: "HGBA1C" No results found for: "VITAMINB12" No results found for: "TSH"     ASSESSMENT AND PLAN  47 y.o. year old female here with:   Dx:  1. Attention deficit disorder (ADD) without hyperactivity   2. Word finding difficulty   3. Unilateral occipital headache   4. Tension headache     PLAN:  MILD WORD FINDING DIFFICULTIES / ATTENTION DEFICIT DISORDER - could be related to history of attention deficit disorder (off meds x 1 year); also with anxiety, insomnia, depression, on medications - follow up with psychiatry and PCP  RIGHT OCCIPITAL HEADACHE  - likely related to muscle tension - consider low dose ibuprofen, tylenol as needed - To prevent or relieve headaches, try the following: Cool Compress. Lie down and place a cool compress on your head.   Avoid headache triggers. If certain foods or odors seem to have triggered your migraines in the past, avoid them. A headache diary might help you identify triggers.   Include physical activity in your daily routine.  Manage stress. Find healthy ways to cope with the stressors, such as delegating tasks on your to-do list.   Practice relaxation techniques. Try deep breathing, yoga, massage and visualization.   Eat regularly. Eating regularly scheduled meals and maintaining a healthy diet might help prevent headaches. Also, drink plenty of fluids.   Follow a regular sleep schedule. Sleep deprivation might contribute to headaches Consider biofeedback. With this mind-body technique, you learn to control certain bodily functions -- such as muscle tension, heart rate and blood pressure -- to prevent headaches or reduce headache pain.  Return for return to PCP, pending if symptoms worsen or fail to improve.  I spent 45 minutes of  face-to-face and non-face-to-face time with patient.  This included previsit chart review, lab review, study review, order entry, electronic health record documentation, patient education.     Suanne Marker, MD 12/20/2022, 1:07 PM Certified in Neurology, Neurophysiology and Neuroimaging  [facility] [address] [place], [place] [address] [phone]

## 2022-12-23 ENCOUNTER — Encounter: Payer: Self-pay | Admitting: Gastroenterology

## 2022-12-23 ENCOUNTER — Encounter (HOSPITAL_COMMUNITY): Payer: Self-pay | Admitting: Gastroenterology

## 2023-05-22 ENCOUNTER — Telehealth: Payer: Self-pay

## 2023-05-22 DIAGNOSIS — R0609 Other forms of dyspnea: Secondary | ICD-10-CM

## 2023-05-22 NOTE — Telephone Encounter (Signed)
Echo reordered

## 2023-06-22 ENCOUNTER — Ambulatory Visit: Payer: Medicare HMO

## 2023-06-29 ENCOUNTER — Ambulatory Visit: Payer: Medicare HMO | Admitting: Cardiology

## 2023-07-19 ENCOUNTER — Ambulatory Visit: Payer: Medicare HMO

## 2023-08-03 ENCOUNTER — Ambulatory Visit: Payer: Medicare HMO | Attending: Cardiology

## 2023-08-03 DIAGNOSIS — R0609 Other forms of dyspnea: Secondary | ICD-10-CM | POA: Diagnosis not present

## 2023-08-03 LAB — ECHOCARDIOGRAM COMPLETE
Area-P 1/2: 4.71 cm2
S' Lateral: 3 cm

## 2023-08-09 ENCOUNTER — Telehealth: Payer: Self-pay

## 2023-08-09 NOTE — Telephone Encounter (Signed)
Echo Results reviewed with pt as per Dr. Vanetta Shawl note.  Pt verbalized understanding and had no additional questions. Routed to PCP
